# Patient Record
Sex: Female | Born: 1975 | Race: Black or African American | Hispanic: No | Marital: Single | Smoking: Current every day smoker
Health system: Southern US, Community
[De-identification: ages and names within clinical notes are randomized; demographics above are authoritative.]

## PROBLEM LIST (undated history)

## (undated) DIAGNOSIS — K219 Gastro-esophageal reflux disease without esophagitis: Secondary | ICD-10-CM

## (undated) HISTORY — PX: CHOLECYSTECTOMY: SHX55

---

## 1997-09-26 ENCOUNTER — Emergency Department (HOSPITAL_COMMUNITY): Admission: EM | Admit: 1997-09-26 | Discharge: 1997-09-26 | Payer: Self-pay | Admitting: Emergency Medicine

## 1997-12-16 ENCOUNTER — Emergency Department (HOSPITAL_COMMUNITY): Admission: EM | Admit: 1997-12-16 | Discharge: 1997-12-16 | Payer: Self-pay | Admitting: Emergency Medicine

## 1998-06-16 ENCOUNTER — Emergency Department (HOSPITAL_COMMUNITY): Admission: EM | Admit: 1998-06-16 | Discharge: 1998-06-16 | Payer: Self-pay | Admitting: *Deleted

## 1999-06-10 ENCOUNTER — Encounter: Payer: Self-pay | Admitting: Emergency Medicine

## 1999-06-10 ENCOUNTER — Emergency Department (HOSPITAL_COMMUNITY): Admission: EM | Admit: 1999-06-10 | Discharge: 1999-06-10 | Payer: Self-pay | Admitting: Emergency Medicine

## 1999-12-05 ENCOUNTER — Inpatient Hospital Stay (HOSPITAL_COMMUNITY): Admission: AD | Admit: 1999-12-05 | Discharge: 1999-12-05 | Payer: Self-pay | Admitting: Obstetrics

## 2000-06-08 ENCOUNTER — Inpatient Hospital Stay (HOSPITAL_COMMUNITY): Admission: AD | Admit: 2000-06-08 | Discharge: 2000-06-08 | Payer: Self-pay | Admitting: Obstetrics

## 2000-07-11 ENCOUNTER — Emergency Department (HOSPITAL_COMMUNITY): Admission: EM | Admit: 2000-07-11 | Discharge: 2000-07-11 | Payer: Self-pay | Admitting: Emergency Medicine

## 2000-12-12 ENCOUNTER — Emergency Department (HOSPITAL_COMMUNITY): Admission: EM | Admit: 2000-12-12 | Discharge: 2000-12-12 | Payer: Self-pay | Admitting: Emergency Medicine

## 2001-05-03 ENCOUNTER — Encounter: Payer: Self-pay | Admitting: Emergency Medicine

## 2001-05-03 ENCOUNTER — Emergency Department (HOSPITAL_COMMUNITY): Admission: EM | Admit: 2001-05-03 | Discharge: 2001-05-03 | Payer: Self-pay

## 2001-09-25 ENCOUNTER — Inpatient Hospital Stay (HOSPITAL_COMMUNITY): Admission: AD | Admit: 2001-09-25 | Discharge: 2001-09-25 | Payer: Self-pay | Admitting: *Deleted

## 2001-09-26 ENCOUNTER — Emergency Department (HOSPITAL_COMMUNITY): Admission: EM | Admit: 2001-09-26 | Discharge: 2001-09-26 | Payer: Self-pay | Admitting: Emergency Medicine

## 2001-11-01 ENCOUNTER — Ambulatory Visit (HOSPITAL_COMMUNITY): Admission: RE | Admit: 2001-11-01 | Discharge: 2001-11-01 | Payer: Self-pay | Admitting: *Deleted

## 2001-11-16 ENCOUNTER — Inpatient Hospital Stay (HOSPITAL_COMMUNITY): Admission: AD | Admit: 2001-11-16 | Discharge: 2001-11-16 | Payer: Self-pay | Admitting: *Deleted

## 2001-11-23 ENCOUNTER — Emergency Department (HOSPITAL_COMMUNITY): Admission: EM | Admit: 2001-11-23 | Discharge: 2001-11-23 | Payer: Self-pay | Admitting: Emergency Medicine

## 2001-12-09 ENCOUNTER — Ambulatory Visit (HOSPITAL_COMMUNITY): Admission: RE | Admit: 2001-12-09 | Discharge: 2001-12-09 | Payer: Self-pay | Admitting: *Deleted

## 2002-02-15 ENCOUNTER — Inpatient Hospital Stay: Admission: AD | Admit: 2002-02-15 | Discharge: 2002-02-15 | Payer: Self-pay | Admitting: *Deleted

## 2002-04-08 ENCOUNTER — Inpatient Hospital Stay (HOSPITAL_COMMUNITY): Admission: AD | Admit: 2002-04-08 | Discharge: 2002-04-08 | Payer: Self-pay | Admitting: Family Medicine

## 2002-05-07 ENCOUNTER — Inpatient Hospital Stay (HOSPITAL_COMMUNITY): Admission: AD | Admit: 2002-05-07 | Discharge: 2002-05-09 | Payer: Self-pay | Admitting: Obstetrics and Gynecology

## 2002-06-08 ENCOUNTER — Emergency Department (HOSPITAL_COMMUNITY): Admission: AD | Admit: 2002-06-08 | Discharge: 2002-06-09 | Payer: Self-pay | Admitting: Emergency Medicine

## 2002-11-03 ENCOUNTER — Emergency Department (HOSPITAL_COMMUNITY): Admission: EM | Admit: 2002-11-03 | Discharge: 2002-11-03 | Payer: Self-pay | Admitting: Emergency Medicine

## 2003-03-01 ENCOUNTER — Emergency Department (HOSPITAL_COMMUNITY): Admission: EM | Admit: 2003-03-01 | Discharge: 2003-03-01 | Payer: Self-pay

## 2003-07-23 ENCOUNTER — Emergency Department (HOSPITAL_COMMUNITY): Admission: EM | Admit: 2003-07-23 | Discharge: 2003-07-24 | Payer: Self-pay | Admitting: Emergency Medicine

## 2003-08-22 ENCOUNTER — Ambulatory Visit (HOSPITAL_COMMUNITY): Admission: RE | Admit: 2003-08-22 | Discharge: 2003-08-22 | Payer: Self-pay | Admitting: Neurology

## 2003-08-25 ENCOUNTER — Encounter: Admission: RE | Admit: 2003-08-25 | Discharge: 2003-08-25 | Payer: Self-pay | Admitting: Neurology

## 2003-08-26 ENCOUNTER — Emergency Department (HOSPITAL_COMMUNITY): Admission: EM | Admit: 2003-08-26 | Discharge: 2003-08-26 | Payer: Self-pay | Admitting: *Deleted

## 2004-01-17 ENCOUNTER — Emergency Department (HOSPITAL_COMMUNITY): Admission: EM | Admit: 2004-01-17 | Discharge: 2004-01-17 | Payer: Self-pay | Admitting: Family Medicine

## 2004-08-02 ENCOUNTER — Ambulatory Visit (HOSPITAL_COMMUNITY): Admission: RE | Admit: 2004-08-02 | Discharge: 2004-08-02 | Payer: Self-pay | Admitting: Obstetrics & Gynecology

## 2004-10-04 ENCOUNTER — Emergency Department (HOSPITAL_COMMUNITY): Admission: EM | Admit: 2004-10-04 | Discharge: 2004-10-04 | Payer: Self-pay | Admitting: Emergency Medicine

## 2004-11-27 ENCOUNTER — Ambulatory Visit (HOSPITAL_COMMUNITY): Admission: RE | Admit: 2004-11-27 | Discharge: 2004-11-27 | Payer: Self-pay | Admitting: Obstetrics & Gynecology

## 2005-01-22 ENCOUNTER — Ambulatory Visit (HOSPITAL_COMMUNITY): Admission: RE | Admit: 2005-01-22 | Discharge: 2005-01-22 | Payer: Self-pay | Admitting: Obstetrics & Gynecology

## 2005-03-07 ENCOUNTER — Inpatient Hospital Stay (HOSPITAL_COMMUNITY): Admission: AD | Admit: 2005-03-07 | Discharge: 2005-03-10 | Payer: Self-pay | Admitting: Obstetrics & Gynecology

## 2006-07-13 ENCOUNTER — Emergency Department (HOSPITAL_COMMUNITY): Admission: EM | Admit: 2006-07-13 | Discharge: 2006-07-13 | Payer: Self-pay | Admitting: Family Medicine

## 2007-08-31 ENCOUNTER — Emergency Department (HOSPITAL_COMMUNITY): Admission: EM | Admit: 2007-08-31 | Discharge: 2007-08-31 | Payer: Self-pay | Admitting: Family Medicine

## 2007-10-06 ENCOUNTER — Emergency Department (HOSPITAL_COMMUNITY): Admission: EM | Admit: 2007-10-06 | Discharge: 2007-10-06 | Payer: Self-pay | Admitting: Emergency Medicine

## 2007-10-27 ENCOUNTER — Emergency Department (HOSPITAL_COMMUNITY): Admission: EM | Admit: 2007-10-27 | Discharge: 2007-10-27 | Payer: Self-pay | Admitting: Emergency Medicine

## 2007-11-13 ENCOUNTER — Emergency Department (HOSPITAL_COMMUNITY): Admission: EM | Admit: 2007-11-13 | Discharge: 2007-11-13 | Payer: Self-pay | Admitting: Emergency Medicine

## 2007-12-19 ENCOUNTER — Emergency Department (HOSPITAL_COMMUNITY): Admission: EM | Admit: 2007-12-19 | Discharge: 2007-12-19 | Payer: Self-pay | Admitting: Emergency Medicine

## 2007-12-27 ENCOUNTER — Encounter: Admission: RE | Admit: 2007-12-27 | Discharge: 2007-12-27 | Payer: Self-pay | Admitting: Internal Medicine

## 2008-01-01 ENCOUNTER — Emergency Department (HOSPITAL_COMMUNITY): Admission: EM | Admit: 2008-01-01 | Discharge: 2008-01-01 | Payer: Self-pay | Admitting: Emergency Medicine

## 2008-02-09 ENCOUNTER — Emergency Department (HOSPITAL_COMMUNITY): Admission: EM | Admit: 2008-02-09 | Discharge: 2008-02-09 | Payer: Self-pay | Admitting: Emergency Medicine

## 2008-02-13 ENCOUNTER — Emergency Department (HOSPITAL_COMMUNITY): Admission: EM | Admit: 2008-02-13 | Discharge: 2008-02-13 | Payer: Self-pay | Admitting: Family Medicine

## 2008-12-30 ENCOUNTER — Inpatient Hospital Stay (HOSPITAL_COMMUNITY): Admission: AD | Admit: 2008-12-30 | Discharge: 2008-12-30 | Payer: Self-pay | Admitting: Obstetrics & Gynecology

## 2009-11-01 ENCOUNTER — Inpatient Hospital Stay (HOSPITAL_COMMUNITY)
Admission: AD | Admit: 2009-11-01 | Discharge: 2009-11-01 | Payer: Self-pay | Source: Home / Self Care | Admitting: Obstetrics & Gynecology

## 2009-11-01 ENCOUNTER — Ambulatory Visit: Payer: Self-pay | Admitting: Family Medicine

## 2009-11-14 ENCOUNTER — Ambulatory Visit: Payer: Self-pay | Admitting: Obstetrics and Gynecology

## 2009-12-05 ENCOUNTER — Ambulatory Visit: Payer: Self-pay | Admitting: Obstetrics and Gynecology

## 2009-12-26 ENCOUNTER — Ambulatory Visit: Payer: Self-pay | Admitting: Obstetrics and Gynecology

## 2009-12-26 LAB — CONVERTED CEMR LAB
Chlamydia, Swab/Urine, PCR: NEGATIVE
GC Probe Amp, Urine: NEGATIVE

## 2009-12-27 ENCOUNTER — Encounter: Payer: Self-pay | Admitting: Obstetrics and Gynecology

## 2010-01-02 ENCOUNTER — Ambulatory Visit: Payer: Self-pay | Admitting: Obstetrics & Gynecology

## 2010-01-09 ENCOUNTER — Ambulatory Visit: Payer: Self-pay | Admitting: Obstetrics & Gynecology

## 2010-01-10 ENCOUNTER — Ambulatory Visit: Payer: Self-pay | Admitting: Obstetrics & Gynecology

## 2010-01-16 ENCOUNTER — Ambulatory Visit: Payer: Self-pay | Admitting: Obstetrics & Gynecology

## 2010-01-23 ENCOUNTER — Ambulatory Visit: Payer: Self-pay | Admitting: Obstetrics and Gynecology

## 2010-01-23 ENCOUNTER — Inpatient Hospital Stay (HOSPITAL_COMMUNITY)
Admission: AD | Admit: 2010-01-23 | Discharge: 2010-01-25 | Payer: Self-pay | Source: Home / Self Care | Attending: Obstetrics & Gynecology | Admitting: Obstetrics & Gynecology

## 2010-02-01 ENCOUNTER — Emergency Department (HOSPITAL_COMMUNITY)
Admission: EM | Admit: 2010-02-01 | Discharge: 2010-02-02 | Payer: Self-pay | Source: Home / Self Care | Admitting: Emergency Medicine

## 2010-04-08 LAB — CBC
HCT: 39 % (ref 36.0–46.0)
Hemoglobin: 13.9 g/dL (ref 12.0–15.0)
MCH: 30.5 pg (ref 26.0–34.0)
MCHC: 35.6 g/dL (ref 30.0–36.0)
MCV: 85.7 fL (ref 78.0–100.0)
Platelets: 299 10*3/uL (ref 150–400)
RBC: 4.55 MIL/uL (ref 3.87–5.11)
RDW: 13.8 % (ref 11.5–15.5)
WBC: 11.6 10*3/uL — ABNORMAL HIGH (ref 4.0–10.5)

## 2010-04-08 LAB — POCT URINALYSIS DIPSTICK
Bilirubin Urine: NEGATIVE
Glucose, UA: NEGATIVE mg/dL
Hgb urine dipstick: NEGATIVE
Ketones, ur: NEGATIVE mg/dL
Nitrite: NEGATIVE
Protein, ur: NEGATIVE mg/dL
Specific Gravity, Urine: 1.015 (ref 1.005–1.030)
Urobilinogen, UA: 1 mg/dL (ref 0.0–1.0)
pH: 6.5 (ref 5.0–8.0)

## 2010-04-08 LAB — RPR: RPR Ser Ql: NONREACTIVE

## 2010-04-09 LAB — POCT URINALYSIS DIPSTICK
Bilirubin Urine: NEGATIVE
Bilirubin Urine: NEGATIVE
Glucose, UA: NEGATIVE mg/dL
Glucose, UA: NEGATIVE mg/dL
Hgb urine dipstick: NEGATIVE
Hgb urine dipstick: NEGATIVE
Ketones, ur: 40 mg/dL — AB
Ketones, ur: NEGATIVE mg/dL
Nitrite: NEGATIVE
Nitrite: NEGATIVE
Protein, ur: NEGATIVE mg/dL
Protein, ur: NEGATIVE mg/dL
Specific Gravity, Urine: 1.015 (ref 1.005–1.030)
Specific Gravity, Urine: 1.02 (ref 1.005–1.030)
Urobilinogen, UA: 1 mg/dL (ref 0.0–1.0)
Urobilinogen, UA: 1 mg/dL (ref 0.0–1.0)
pH: 5.5 (ref 5.0–8.0)
pH: 6.5 (ref 5.0–8.0)

## 2010-04-10 LAB — CBC
HCT: 33.1 % — ABNORMAL LOW (ref 36.0–46.0)
Hemoglobin: 11.6 g/dL — ABNORMAL LOW (ref 12.0–15.0)
MCH: 34 pg (ref 26.0–34.0)
MCHC: 35.1 g/dL (ref 30.0–36.0)
MCV: 96.7 fL (ref 78.0–100.0)
Platelets: 261 10*3/uL (ref 150–400)
RBC: 3.42 MIL/uL — ABNORMAL LOW (ref 3.87–5.11)
RDW: 14.2 % (ref 11.5–15.5)
WBC: 10.6 10*3/uL — ABNORMAL HIGH (ref 4.0–10.5)

## 2010-04-10 LAB — URINE MICROSCOPIC-ADD ON

## 2010-04-10 LAB — URINALYSIS, ROUTINE W REFLEX MICROSCOPIC
Bilirubin Urine: NEGATIVE
Glucose, UA: NEGATIVE mg/dL
Hgb urine dipstick: NEGATIVE
Ketones, ur: 15 mg/dL — AB
Nitrite: NEGATIVE
Protein, ur: NEGATIVE mg/dL
Specific Gravity, Urine: >1.03 — ABNORMAL HIGH (ref 1.005–1.030)
Urobilinogen, UA: 4 mg/dL — ABNORMAL HIGH (ref 0.0–1.0)
pH: 6 (ref 5.0–8.0)

## 2010-04-10 LAB — DIFFERENTIAL
Basophils Absolute: 0 10*3/uL (ref 0.0–0.1)
Basophils Relative: 0 % (ref 0–1)
Eosinophils Absolute: 0.4 10*3/uL (ref 0.0–0.7)
Eosinophils Relative: 4 % (ref 0–5)
Lymphocytes Relative: 10 % — ABNORMAL LOW (ref 12–46)
Lymphs Abs: 1.1 10*3/uL (ref 0.7–4.0)
Monocytes Absolute: 1.1 10*3/uL — ABNORMAL HIGH (ref 0.1–1.0)
Monocytes Relative: 10 % (ref 3–12)
Neutro Abs: 8 10*3/uL — ABNORMAL HIGH (ref 1.7–7.7)
Neutrophils Relative %: 75 % (ref 43–77)

## 2010-04-10 LAB — RPR: RPR Ser Ql: NONREACTIVE

## 2010-04-10 LAB — STREP B DNA PROBE: Strep Group B Ag: NEGATIVE

## 2010-04-10 LAB — RAPID URINE DRUG SCREEN, HOSP PERFORMED: Barbiturates: NOT DETECTED

## 2010-04-10 LAB — POCT URINALYSIS DIPSTICK
Bilirubin Urine: NEGATIVE
Glucose, UA: NEGATIVE mg/dL
Hgb urine dipstick: NEGATIVE
Ketones, ur: NEGATIVE mg/dL
Nitrite: NEGATIVE
Protein, ur: NEGATIVE mg/dL
Specific Gravity, Urine: 1.01 (ref 1.005–1.030)
Urobilinogen, UA: 1 mg/dL (ref 0.0–1.0)
pH: 6 (ref 5.0–8.0)

## 2010-04-10 LAB — HEPATITIS B SURFACE ANTIGEN: Hepatitis B Surface Ag: NEGATIVE

## 2010-04-10 LAB — HIV ANTIBODY (ROUTINE TESTING W REFLEX): HIV: NONREACTIVE

## 2010-04-10 LAB — TYPE AND SCREEN
ABO/RH(D): B POS
Antibody Screen: NEGATIVE

## 2010-04-10 LAB — SICKLE CELL SCREEN: Sickle Cell Screen: NEGATIVE

## 2010-04-10 LAB — RUBELLA SCREEN: Rubella: 18.7 IU/mL — ABNORMAL HIGH

## 2010-04-30 LAB — HCG, QUANTITATIVE, PREGNANCY: hCG, Beta Chain, Quant, S: 8489 m[IU]/mL — ABNORMAL HIGH (ref <5)

## 2010-04-30 LAB — ABO/RH: ABO/RH(D): B POS

## 2010-04-30 LAB — WET PREP, GENITAL: Trich, Wet Prep: NONE SEEN

## 2010-04-30 LAB — CBC
MCHC: 32.8 g/dL (ref 30.0–36.0)
MCV: 89.6 fL (ref 78.0–100.0)
RDW: 13.4 % (ref 11.5–15.5)

## 2010-04-30 LAB — GC/CHLAMYDIA PROBE AMP, GENITAL
Chlamydia, DNA Probe: NEGATIVE
GC Probe Amp, Genital: NEGATIVE

## 2010-05-13 LAB — URINALYSIS, ROUTINE W REFLEX MICROSCOPIC
Bilirubin Urine: NEGATIVE
Glucose, UA: NEGATIVE mg/dL
Hgb urine dipstick: NEGATIVE
Protein, ur: NEGATIVE mg/dL
Specific Gravity, Urine: 1.018 (ref 1.005–1.030)
Urobilinogen, UA: 1 mg/dL (ref 0.0–1.0)

## 2010-05-13 LAB — WET PREP, GENITAL
Trich, Wet Prep: NONE SEEN
Yeast Wet Prep HPF POC: NONE SEEN

## 2010-05-13 LAB — GC/CHLAMYDIA PROBE AMP, GENITAL: Chlamydia, DNA Probe: NEGATIVE

## 2010-06-14 NOTE — H&P (Signed)
NAMEDHARMA, PARE           ACCOUNT NO.:  [id number]   MEDICAL RECORD NO.:  [id number]          PATIENT TYPE:  AMB   LOCATION:  SDC                           FACILITY:  [facility]   PHYSICIAN:  Roseanna Rainbow, M.D.DATE OF BIRTH:  [birth year]   DATE OF ADMISSION:  DATE OF DISCHARGE:                                HISTORY & PHYSICAL   HISTORY OF PRESENT ILLNESS:  The patient is a 35 year old who presents for a  laparoscopic bilateral tubal ligation.   ALLERGIES:  No known drug allergies.   PAST MEDICAL HISTORY:  She denies.   PAST SURGICAL HISTORY:  She denies.   SOCIAL HISTORY:  She has a history of tobacco use.  Does not give any  significant history of alcohol usage.  Denies illicit drug use.   FAMILY HISTORY:  Asthma, hypertension.   PAST OBSTETRICAL HISTORY:  She is status post three spontaneous vaginal  deliveries.   MEDICATIONS:  None.   PHYSICAL EXAMINATION:  VITAL SIGNS:  Stable.  Afebrile.  GENERAL:  Well-developed, well-nourished, in no apparent distress.  LUNGS:  Clear to auscultation.  HEART:  Regular rate and rhythm.  ABDOMEN:  Soft, nontender.  No organomegaly.  PELVIC:  Normal EG/BUS.  Bimanual exam - the uterus is small, anteverted,  nontender.  Adnexa are nonpalpable, nontender bilaterally.   ASSESSMENT:  Multiparous female who desires a sterilization procedure.   PLAN:  The planned procedure is a laparoscopic bilateral tubal ligation.  The risks, benefits and alternative forms of management were reviewed with  the patient, and informed consent has been obtained.      Roseanna Rainbow, M.D.  Electronically Signed     LAJ/MEDQ  D:  05/07/2005  T:  05/07/2005  Job:  [id number]

## 2010-10-28 LAB — POCT URINALYSIS DIP (DEVICE)
Glucose, UA: NEGATIVE
Nitrite: NEGATIVE
Specific Gravity, Urine: >=1.03
pH: 6

## 2010-10-28 LAB — COMPREHENSIVE METABOLIC PANEL
Albumin: 3.6
Alkaline Phosphatase: 65
BUN: 7
Calcium: 9.9
Creatinine, Ser: 0.71
Glucose, Bld: 109 — ABNORMAL HIGH
Total Protein: 6.9

## 2010-10-28 LAB — DIFFERENTIAL
Basophils Relative: 1
Lymphocytes Relative: 32
Monocytes Absolute: 0.5
Monocytes Relative: 9
Neutro Abs: 3
Neutrophils Relative %: 56

## 2010-10-28 LAB — CBC
HCT: 39.8
Hemoglobin: 13.5
MCHC: 33.8
MCV: 86.2
Platelets: 383
RDW: 13.8

## 2010-10-28 LAB — URINALYSIS, ROUTINE W REFLEX MICROSCOPIC
Glucose, UA: NEGATIVE
Hgb urine dipstick: NEGATIVE
Ketones, ur: NEGATIVE
Protein, ur: NEGATIVE
Urobilinogen, UA: 0.2

## 2010-10-28 LAB — POCT PREGNANCY, URINE: Preg Test, Ur: NEGATIVE

## 2010-10-28 LAB — LIPASE, BLOOD: Lipase: 28

## 2010-10-28 LAB — WET PREP, GENITAL
Clue Cells Wet Prep HPF POC: NONE SEEN
Yeast Wet Prep HPF POC: NONE SEEN

## 2010-10-28 LAB — GC/CHLAMYDIA PROBE AMP, GENITAL: Chlamydia, DNA Probe: NEGATIVE

## 2010-10-30 LAB — DIFFERENTIAL
Basophils Absolute: 0.3 — ABNORMAL HIGH
Basophils Relative: 4 — ABNORMAL HIGH
Eosinophils Relative: 3
Monocytes Absolute: 0.9
Monocytes Relative: 13 — ABNORMAL HIGH
Neutro Abs: 2.3

## 2010-10-30 LAB — COMPREHENSIVE METABOLIC PANEL
AST: 15
Albumin: 3.5
Alkaline Phosphatase: 63
BUN: 8
Chloride: 106
GFR calc Af Amer: >60
Potassium: 3.5
Sodium: 138
Total Bilirubin: 0.5
Total Protein: 6.6

## 2010-10-30 LAB — CBC
Platelets: 334
RDW: 13.3
WBC: 7.3

## 2010-10-30 LAB — POCT CARDIAC MARKERS: Troponin i, poc: <0.05

## 2010-11-01 LAB — COMPREHENSIVE METABOLIC PANEL
Alkaline Phosphatase: 71 U/L (ref 39–117)
BUN: 12 mg/dL (ref 6–23)
Chloride: 103 mEq/L (ref 96–112)
Creatinine, Ser: 0.8 mg/dL (ref 0.4–1.2)
GFR calc non Af Amer: >60 mL/min (ref >60)
Glucose, Bld: 98 mg/dL (ref 70–99)
Potassium: 4.3 mEq/L (ref 3.5–5.1)
Total Bilirubin: 0.9 mg/dL (ref 0.3–1.2)

## 2010-11-01 LAB — CBC
HCT: 42.7 % (ref 36.0–46.0)
Hemoglobin: 14.3 g/dL (ref 12.0–15.0)
MCV: 86.1 fL (ref 78.0–100.0)
Platelets: 351 10*3/uL (ref 150–400)
RDW: 13.4 % (ref 11.5–15.5)
WBC: 7.4 10*3/uL (ref 4.0–10.5)

## 2010-11-01 LAB — DIFFERENTIAL
Basophils Absolute: 0 10*3/uL (ref 0.0–0.1)
Basophils Relative: 0 % (ref 0–1)
Lymphocytes Relative: 26 % (ref 12–46)
Neutro Abs: 4.7 10*3/uL (ref 1.7–7.7)
Neutrophils Relative %: 63 % (ref 43–77)

## 2010-11-01 LAB — POCT CARDIAC MARKERS
CKMB, poc: <1 ng/mL — ABNORMAL LOW (ref 1.0–8.0)
Myoglobin, poc: 62.9 ng/mL (ref 12–200)

## 2010-11-01 LAB — LIPASE, BLOOD: Lipase: 32 U/L (ref 11–59)

## 2012-07-20 ENCOUNTER — Emergency Department (HOSPITAL_COMMUNITY)
Admission: EM | Admit: 2012-07-20 | Discharge: 2012-07-20 | Disposition: A | Discharge status: Home / Self Care | Payer: Medicaid Other | Attending: Emergency Medicine | Admitting: Emergency Medicine

## 2012-07-20 ENCOUNTER — Encounter (HOSPITAL_COMMUNITY): Payer: Self-pay | Admitting: Emergency Medicine

## 2012-07-20 DIAGNOSIS — Z3202 Encounter for pregnancy test, result negative: Secondary | ICD-10-CM | POA: Insufficient documentation

## 2012-07-20 DIAGNOSIS — F17200 Nicotine dependence, unspecified, uncomplicated: Secondary | ICD-10-CM | POA: Insufficient documentation

## 2012-07-20 DIAGNOSIS — N949 Unspecified condition associated with female genital organs and menstrual cycle: Secondary | ICD-10-CM | POA: Insufficient documentation

## 2012-07-20 DIAGNOSIS — N390 Urinary tract infection, site not specified: Secondary | ICD-10-CM

## 2012-07-20 LAB — URINE MICROSCOPIC-ADD ON

## 2012-07-20 LAB — URINALYSIS, ROUTINE W REFLEX MICROSCOPIC
Bilirubin Urine: NEGATIVE
Glucose, UA: NEGATIVE mg/dL
Ketones, ur: NEGATIVE mg/dL
Protein, ur: 30 mg/dL — AB

## 2012-07-20 LAB — PREGNANCY, URINE: Preg Test, Ur: NEGATIVE

## 2012-07-20 MED ORDER — CEPHALEXIN 500 MG PO CAPS: 500.0000 mg | ORAL_CAPSULE | Freq: Four times a day (QID) | ORAL | Status: DC

## 2012-07-20 MED ORDER — PHENAZOPYRIDINE HCL 200 MG PO TABS: 200.0000 mg | ORAL_TABLET | Freq: Three times a day (TID) | ORAL | Status: DC

## 2012-07-20 MED ORDER — CEPHALEXIN 500 MG PO CAPS
500.0000 mg | ORAL_CAPSULE | Freq: Once | ORAL | Status: AC
  Administered 2012-07-20: 500 mg via ORAL
  Filled 2012-07-20: qty 1

## 2012-07-20 NOTE — ED Notes (Signed)
Pt c/o pain during urination since Friday.  Has been drinking water and cranberry juice hoping it would help but states she is still experiencing dysuria.

## 2012-07-20 NOTE — ED Provider Notes (Signed)
History    CSN: [id number] Arrival date & time 07/20/12  1035  First MD Initiated Contact with Patient 07/20/12 1041     Chief Complaint  Patient presents with  . Dysuria   (Consider location/radiation/quality/duration/timing/severity/associated sxs/prior Treatment) Patient is a 37 y.o. female presenting with dysuria. The history is provided by the patient. No language interpreter was used.  Dysuria Pain quality:  Burning Pain severity now: Pain with urination only. Urinary symptoms: foul-smelling urine   Urinary symptoms: no hematuria   Associated symptoms: no abdominal pain, no fever, no flank pain, no nausea, no vaginal discharge and no vomiting   Associated symptoms comment:  Painful urination for the past 3 days. No abdominal pain, nausea, vomiting or fever. She denies vaginal discharge.   History reviewed. No pertinent past medical history. Past Surgical History  Procedure Laterality Date  . Cholecystectomy     History reviewed. No pertinent family history. History  Substance Use Topics  . Smoking status: Current Every Day Smoker -- 0.50 packs/day    Types: Cigarettes  . Smokeless tobacco: Not on file  . Alcohol Use: No   OB History   Grav Para Term Preterm Abortions TAB SAB Ect Mult Living                 Review of Systems  Constitutional: Negative for fever and chills.  Gastrointestinal: Negative.  Negative for nausea, vomiting and abdominal pain.  Genitourinary: Positive for dysuria and pelvic pain. Negative for flank pain and vaginal discharge.  Musculoskeletal: Negative.  Negative for back pain.  Skin: Negative.     Allergies  Review of patient's allergies indicates no known allergies.  Home Medications   Current Outpatient Rx  Name  Route  Sig  Dispense  Refill  . ibuprofen (ADVIL,MOTRIN) 200 MG tablet   Oral   Take 600 mg by mouth every 6 (six) hours as needed for pain.          BP 123/68  Pulse 87  Temp(Src) 98.6 F (37 C) (Oral)  Resp  18  SpO2 96%  LMP 07/13/2012 Physical Exam  Constitutional: She is oriented to person, place, and time. She appears well-developed and well-nourished.  Neck: Normal range of motion.  Pulmonary/Chest: Effort normal.  Abdominal: Soft. There is no tenderness. There is no rebound and no guarding.  Neurological: She is alert and oriented to person, place, and time.  Skin: Skin is warm and dry.    ED Course  Procedures (including critical care time) Labs Reviewed  URINALYSIS, ROUTINE W REFLEX MICROSCOPIC  PREGNANCY, URINE   Results for orders placed during the hospital encounter of 07/20/12  URINALYSIS, ROUTINE W REFLEX MICROSCOPIC      Result Value Range   Color, Urine YELLOW  YELLOW   APPearance CLOUDY (*) CLEAR   Specific Gravity, Urine 1.023  1.005 - 1.030   pH 5.5  5.0 - 8.0   Glucose, UA NEGATIVE  NEGATIVE mg/dL   Hgb urine dipstick SMALL (*) NEGATIVE   Bilirubin Urine NEGATIVE  NEGATIVE   Ketones, ur NEGATIVE  NEGATIVE mg/dL   Protein, ur 30 (*) NEGATIVE mg/dL   Urobilinogen, UA 0.2  0.0 - 1.0 mg/dL   Nitrite NEGATIVE  NEGATIVE   Leukocytes, UA LARGE (*) NEGATIVE  PREGNANCY, URINE      Result Value Range   Preg Test, Ur NEGATIVE  NEGATIVE  URINE MICROSCOPIC-ADD ON      Result Value Range   Squamous Epithelial / LPF FEW (*) RARE  WBC, UA TOO NUMEROUS TO COUNT  <3 WBC/hpf   RBC / HPF 7-10  <3 RBC/hpf   Bacteria, UA FEW (*) RARE    No results found. No diagnosis found. 1. UTI  MDM  Uncomplicated UTI. Patient is stable, NAD.  Arnoldo Hooker, PA-C 07/20/12 1213

## 2012-07-21 NOTE — ED Provider Notes (Signed)
Medical screening examination/treatment/procedure(s) were performed by non-physician practitioner and as supervising physician I was immediately available for consultation/collaboration.    Vida Roller, MD 07/21/12 1739

## 2012-07-22 LAB — URINE CULTURE: Colony Count: >=100000

## 2012-07-23 NOTE — ED Notes (Signed)
Post ED Visit - Positive Culture Follow-up  Culture report reviewed by antimicrobial stewardship pharmacist: []  Wes Dulaney, Pharm.D., BCPS [x]  Celedonio Miyamoto, Pharm.D., BCPS []  Georgina Pillion, Pharm.D., BCPS []  [place], [address].D., BCPS, AAHIVP []  Estella Husk, Pharm.D., BCPS, AAHIVP  Positive Urine culture Treated with Cephalexin, organism sensitive to the same and no further patient follow-up is required at this time.  Larena Sox 07/23/2012, 1:55 PM

## 2012-08-03 ENCOUNTER — Emergency Department (HOSPITAL_COMMUNITY)
Admission: EM | Admit: 2012-08-03 | Discharge: 2012-08-03 | Disposition: A | Discharge status: Home / Self Care | Payer: Medicaid Other | Attending: Emergency Medicine | Admitting: Emergency Medicine

## 2012-08-03 ENCOUNTER — Encounter (HOSPITAL_COMMUNITY): Payer: Self-pay | Admitting: Emergency Medicine

## 2012-08-03 DIAGNOSIS — Z87440 Personal history of urinary (tract) infections: Secondary | ICD-10-CM | POA: Insufficient documentation

## 2012-08-03 DIAGNOSIS — J069 Acute upper respiratory infection, unspecified: Secondary | ICD-10-CM | POA: Insufficient documentation

## 2012-08-03 DIAGNOSIS — H9209 Otalgia, unspecified ear: Secondary | ICD-10-CM | POA: Insufficient documentation

## 2012-08-03 DIAGNOSIS — J029 Acute pharyngitis, unspecified: Secondary | ICD-10-CM | POA: Insufficient documentation

## 2012-08-03 DIAGNOSIS — N949 Unspecified condition associated with female genital organs and menstrual cycle: Secondary | ICD-10-CM | POA: Insufficient documentation

## 2012-08-03 DIAGNOSIS — F17200 Nicotine dependence, unspecified, uncomplicated: Secondary | ICD-10-CM | POA: Insufficient documentation

## 2012-08-03 DIAGNOSIS — N898 Other specified noninflammatory disorders of vagina: Secondary | ICD-10-CM

## 2012-08-03 DIAGNOSIS — Z9889 Other specified postprocedural states: Secondary | ICD-10-CM | POA: Insufficient documentation

## 2012-08-03 DIAGNOSIS — L293 Anogenital pruritus, unspecified: Secondary | ICD-10-CM | POA: Insufficient documentation

## 2012-08-03 DIAGNOSIS — R109 Unspecified abdominal pain: Secondary | ICD-10-CM | POA: Insufficient documentation

## 2012-08-03 DIAGNOSIS — Z3202 Encounter for pregnancy test, result negative: Secondary | ICD-10-CM | POA: Insufficient documentation

## 2012-08-03 DIAGNOSIS — Z9089 Acquired absence of other organs: Secondary | ICD-10-CM | POA: Insufficient documentation

## 2012-08-03 LAB — URINALYSIS, ROUTINE W REFLEX MICROSCOPIC
Bilirubin Urine: NEGATIVE
Glucose, UA: NEGATIVE mg/dL
Hgb urine dipstick: NEGATIVE
Ketones, ur: NEGATIVE mg/dL
Nitrite: NEGATIVE
Protein, ur: NEGATIVE mg/dL
Specific Gravity, Urine: 1.016 (ref 1.005–1.030)
Urobilinogen, UA: 1 mg/dL (ref 0.0–1.0)
pH: 5.5 (ref 5.0–8.0)

## 2012-08-03 LAB — URINE MICROSCOPIC-ADD ON

## 2012-08-03 LAB — WET PREP, GENITAL
Clue Cells Wet Prep HPF POC: NONE SEEN
Trich, Wet Prep: NONE SEEN
Yeast Wet Prep HPF POC: NONE SEEN

## 2012-08-03 LAB — PREGNANCY, URINE: Preg Test, Ur: NEGATIVE

## 2012-08-03 MED ORDER — FLUCONAZOLE 150 MG PO TABS: 150.0000 mg | ORAL_TABLET | Freq: Once | ORAL | Status: DC

## 2012-08-03 NOTE — ED Notes (Signed)
Pt c/o vaginal itching after being on antibiotics for UTI; pt sts right ear pain and sore throat x 2 days

## 2012-08-03 NOTE — ED Provider Notes (Signed)
History    CSN: [id number] Arrival date & time 08/03/12  1750  First MD Initiated Contact with Patient 08/03/12 1757     Chief Complaint  Patient presents with  . Vaginal Itching  . Otalgia  . Sore Throat   (Consider location/radiation/quality/duration/timing/severity/associated sxs/prior Treatment) HPI Pt is a 37yo female with recent tx with keflex and pyridium for UTI last week, presenting with 2 day hx of vaginal itching and irritation, concerned for yeast infection so she has tried monostat and vagisil wipes, however states irritation is worsening, with lower abdominal cramping 7/10.  Denies vaginal discharge or bleeding. Reports LMP last month, nl per pt but pt requesting pregnancy test.  Pt also c/o 1 day hx of right ear pain, 2/10, improved with ibuprofen, and mild sore throat that started this morning.  Denies difficulty breathing or swallowing. Denies chest pain, cough, SOB, fever, n/v/d. Denies dysuria or hematuria. Pt states she is not on birth control.   History reviewed. No pertinent past medical history. Past Surgical History  Procedure Laterality Date  . Cholecystectomy     History reviewed. No pertinent family history. History  Substance Use Topics  . Smoking status: Current Every Day Smoker -- 0.50 packs/day    Types: Cigarettes  . Smokeless tobacco: Not on file  . Alcohol Use: No   OB History   Grav Para Term Preterm Abortions TAB SAB Ect Mult Living                 Review of Systems  Constitutional: Negative for fever and chills.  HENT: Positive for ear pain ( right) and sore throat. Negative for hearing loss, congestion, drooling, mouth sores, trouble swallowing, neck pain, neck stiffness, dental problem, voice change, sinus pressure and ear discharge.   Respiratory: Negative for cough and shortness of breath.   Cardiovascular: Negative for chest pain.  Gastrointestinal: Positive for abdominal pain ( lower).  Genitourinary: Positive for vaginal pain (  itching and irritation). Negative for dysuria, urgency, hematuria, flank pain, vaginal bleeding, vaginal discharge and menstrual problem.  All other systems reviewed and are negative.    Allergies  Review of patient's allergies indicates no known allergies.  Home Medications   Current Outpatient Rx  Name  Route  Sig  Dispense  Refill  . ibuprofen (ADVIL,MOTRIN) 200 MG tablet   Oral   Take 600 mg by mouth every 6 (six) hours as needed for pain.         . fluconazole (DIFLUCAN) 150 MG tablet   Oral   Take 1 tablet (150 mg total) by mouth once.   1 tablet   0    BP 136/78  Pulse 94  Temp(Src) 98.8 F (37.1 C) (Oral)  Resp 18  SpO2 97%  LMP 07/13/2012 Physical Exam  Nursing note and vitals reviewed. Constitutional: She appears well-developed and well-nourished. No distress.  Pt resting comfortably in exam bed. NAD.   HENT:  Head: Normocephalic and atraumatic.  Right Ear: Hearing, tympanic membrane, external ear and ear canal normal.  Left Ear: Hearing, tympanic membrane, external ear and ear canal normal.  Nose: Nose normal.  Mouth/Throat: Uvula is midline and mucous membranes are normal. Posterior oropharyngeal erythema present. No oropharyngeal exudate, posterior oropharyngeal edema or tonsillar abscesses.  Eyes: Conjunctivae are normal. No scleral icterus.  Neck: Normal range of motion. Neck supple.  No nuchal rigidity or meningeal signs.    Cardiovascular: Normal rate, regular rhythm and normal heart sounds.   Pulmonary/Chest: Effort normal and  breath sounds normal. No respiratory distress. She has no wheezes. She has no rales. She exhibits no tenderness.  Abdominal: Soft. Bowel sounds are normal. She exhibits no distension and no mass. There is no tenderness. There is no rebound and no guarding. Hernia confirmed negative in the right inguinal area and confirmed negative in the left inguinal area.  Soft, NDNT  Genitourinary: Uterus normal. No labial fusion. There is  no rash, tenderness, lesion or injury on the right labia. There is no rash, tenderness, lesion or injury on the left labia. Cervix exhibits discharge. Cervix exhibits no motion tenderness and no friability. Right adnexum displays no mass, no tenderness and no fullness. Left adnexum displays no mass, no tenderness and no fullness. No erythema, tenderness or bleeding around the vagina. No foreign body around the vagina. No signs of injury around the vagina. Vaginal discharge ( thick, white) found.  Thick white vaginal discharge, no adnexal tenderness or masses, no CMT.  Musculoskeletal: Normal range of motion.  Lymphadenopathy:       Right: No inguinal adenopathy present.       Left: No inguinal adenopathy present.  Neurological: She is alert.  Skin: Skin is warm and dry. She is not diaphoretic.    ED Course  Procedures (including critical care time) Labs Reviewed  WET PREP, GENITAL - Abnormal; Notable for the following:    WBC, Wet Prep HPF POC FEW (*)    All other components within normal limits  URINALYSIS, ROUTINE W REFLEX MICROSCOPIC - Abnormal; Notable for the following:    Leukocytes, UA SMALL (*)    All other components within normal limits  URINE MICROSCOPIC-ADD ON - Abnormal; Notable for the following:    Squamous Epithelial / LPF FEW (*)    Bacteria, UA FEW (*)    All other components within normal limits  GC/CHLAMYDIA PROBE AMP  URINE CULTURE  PREGNANCY, URINE   No results found. 1. Vaginal itching   2. URI, acute     MDM  Pt has likely yeast infection.  Will get UA, urine preg, wet prep and gc/chlamydia and tx appropriately.    Right ear pain and throat pain: exam- R ear: nl TM & ear canal. L ear: nl. Throat: mild erythema w/o edema or exudate.  Will tx as viral URI.  Advised pt to tx pain with OTC acetaminophen and ibuprofen.    UA: unremarkable Urine preg: neg Wet prep: unremarkable  Discussed pt with Dr. Ethelda Chick, will tx pt with 1 dose of diflucan. Rx:  diflucan Will discharge pt home and have her f/u with Dr. Concepcion Elk. Return precautions given. Pt verbalized understanding and agreement with tx plan. Vitals: unremarkable. Discharged in stable condition.      Junius Finner, PA-C 08/03/12 1857

## 2012-08-04 LAB — URINE CULTURE
Colony Count: NO GROWTH
Culture: NO GROWTH

## 2012-08-04 LAB — GC/CHLAMYDIA PROBE AMP
CT Probe RNA: NEGATIVE
GC Probe RNA: NEGATIVE

## 2012-08-04 NOTE — ED Provider Notes (Signed)
Medical screening examination/treatment/procedure(s) were performed by non-physician practitioner and as supervising physician I was immediately available for consultation/collaboration.  Evaan Tidwell, MD 08/04/12 0152 

## 2013-06-23 ENCOUNTER — Encounter (HOSPITAL_COMMUNITY): Payer: Self-pay | Admitting: Emergency Medicine

## 2013-06-23 ENCOUNTER — Emergency Department (HOSPITAL_COMMUNITY)
Admission: EM | Admit: 2013-06-23 | Discharge: 2013-06-23 | Disposition: A | Discharge status: Home / Self Care | Payer: Medicaid Other | Attending: Emergency Medicine | Admitting: Emergency Medicine

## 2013-06-23 DIAGNOSIS — Y9289 Other specified places as the place of occurrence of the external cause: Secondary | ICD-10-CM | POA: Insufficient documentation

## 2013-06-23 DIAGNOSIS — Y9389 Activity, other specified: Secondary | ICD-10-CM | POA: Insufficient documentation

## 2013-06-23 DIAGNOSIS — Y990 Civilian activity done for income or pay: Secondary | ICD-10-CM | POA: Insufficient documentation

## 2013-06-23 DIAGNOSIS — T6391XA Toxic effect of contact with unspecified venomous animal, accidental (unintentional), initial encounter: Secondary | ICD-10-CM | POA: Insufficient documentation

## 2013-06-23 DIAGNOSIS — T63461A Toxic effect of venom of wasps, accidental (unintentional), initial encounter: Secondary | ICD-10-CM | POA: Insufficient documentation

## 2013-06-23 DIAGNOSIS — F17200 Nicotine dependence, unspecified, uncomplicated: Secondary | ICD-10-CM | POA: Insufficient documentation

## 2013-06-23 DIAGNOSIS — M7989 Other specified soft tissue disorders: Secondary | ICD-10-CM | POA: Insufficient documentation

## 2013-06-23 DIAGNOSIS — T7840XA Allergy, unspecified, initial encounter: Secondary | ICD-10-CM

## 2013-06-23 MED ORDER — PREDNISONE 20 MG PO TABS
60.0000 mg | ORAL_TABLET | Freq: Once | ORAL | Status: AC
  Administered 2013-06-23: 60 mg via ORAL
  Filled 2013-06-23: qty 3

## 2013-06-23 MED ORDER — PREDNISONE 20 MG PO TABS: 40.0000 mg | ORAL_TABLET | Freq: Every day | ORAL | Status: DC

## 2013-06-23 MED ORDER — FAMOTIDINE 20 MG PO TABS: 20.0000 mg | ORAL_TABLET | Freq: Two times a day (BID) | ORAL | Status: DC

## 2013-06-23 NOTE — ED Notes (Signed)
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 Has been taking benadryl -- last being 2 hours ago.

## 2013-06-23 NOTE — ED Notes (Signed)
Unable to locate patient for triage x3 

## 2013-06-23 NOTE — ED Provider Notes (Signed)
CSN: [id number]     Arrival date & time 06/23/13  1751 History  This chart was scribed for non-physician practitioner Junious SilkHannah Dane Bloch, PA working with Junius ArgyleForrest S Harrison, MD by Elveria Risingimelie Horne, ED Scribe. This patient was seen in room TR05C/TR05C and the patient's care was started at 8:34 PM.   Chief Complaint  Patient presents with  . Insect Bite     The history is provided by the patient. No language interpreter was used.   HPI Comments: Sherri Hall is a 38 y.o. female who presents to the Emergency Department with several insect bites to her left forearm. Patient reports taking the trash out while at work yesterday and being stung by 2 yellow jackets. Patient reports immediate pain and redness after the attack. Patient presents with itching, redness and swelling at her bite sites. Patient has taken Benadryl, without relief. Patient able to remove the stingers.  Patient denies sensation of her throat closing, nausea, or vomiting.  Patient denies medical issues.    History reviewed. No pertinent past medical history. Past Surgical History  Procedure Laterality Date  . Cholecystectomy     No family history on file. History  Substance Use Topics  . Smoking status: Current Every Day Smoker -- 0.50 packs/day    Types: Cigarettes  . Smokeless tobacco: Not on file  . Alcohol Use: No   OB History   Grav Para Term Preterm Abortions TAB SAB Ect Mult Living                 Review of Systems  Constitutional: Negative for fever.  Gastrointestinal: Negative for nausea and vomiting.  Skin:       Insect bites  All other systems reviewed and are negative.     Allergies  Review of patient's allergies indicates no known allergies.  Home Medications   Prior to Admission medications   Not on File   Triage Vitals: BP 120/50  Pulse 97  Temp(Src) 98.2 F (36.8 C) (Oral)  Resp 20  SpO2 98%  LMP 06/09/2013 Physical Exam  Nursing note and vitals reviewed. Constitutional: She is  oriented to person, place, and time. She appears well-developed and well-nourished. No distress.  HENT:  Head: Normocephalic and atraumatic.  Right Ear: External ear normal.  Left Ear: External ear normal.  Nose: Nose normal.  Mouth/Throat: Oropharynx is clear and moist.  Eyes: Conjunctivae are normal.  Neck: Normal range of motion.  Cardiovascular: Normal rate, regular rhythm and normal heart sounds.   Pulmonary/Chest: Effort normal and breath sounds normal. No stridor. No respiratory distress. She has no wheezes. She has no rales.  Abdominal: Soft. She exhibits no distension.  Musculoskeletal: Normal range of motion.  Neurological: She is alert and oriented to person, place, and time. She has normal strength.  Skin: Skin is warm and dry. She is not diaphoretic. There is erythema.  Left arm: 8 cm area of warmth , erythema and swelling to left distal arm. Blanches to palpation.  No stingers seen retained in skin.  Compartment soft. Neurovascularly intact.   Psychiatric: She has a normal mood and affect. Her behavior is normal.    ED Course  Procedures (including critical care time) DIAGNOSTIC STUDIES: Oxygen Saturation is 98% on room air, normal by my interpretation.    COORDINATION OF CARE: 8:38 PM- Discussed treatment plan with patient at bedside and patient agreed to plan.    Labs Review Labs Reviewed - No data to display  Imaging Review No results found.  EKG Interpretation None      MDM   Final diagnoses:  Wasp sting  Allergic reaction   Patient presents to ED with continued redness and itching from wasp sting yesterday. There is no superimposed bacterial infection. Will d/c home with pepcid, prednisone burst, and benadryl. Return instructions given. Vital signs stable for discharge. Patient / Family / Caregiver informed of clinical course, understand medical decision-making process, and agree with plan.   I personally performed the services described in this  documentation, which was scribed in my presence. The recorded information has been reviewed and is accurate.    Mora Bellman, PA-C 06/24/13 1610

## 2013-06-23 NOTE — Discharge Instructions (Signed)
Bee, Wasp, or Hornet Sting °Your caregiver has diagnosed you as having an insect sting. An insect sting appears as a red lump in the skin that sometimes has a tiny hole in the center, or it may have a stinger in the center of the wound. The most common stings are from wasps, hornets and bees. °Individuals have different reactions to insect stings. °· A normal reaction may cause pain, swelling, and redness around the sting site. °· A localized allergic reaction may cause swelling and redness that extends beyond the sting site. °· A large local reaction may continue to develop over the next 12 to 36 hours. °· On occasion, the reactions can be severe (anaphylactic reaction). An anaphylactic reaction may cause wheezing; difficulty breathing; chest pain; fainting; raised, itchy, red patches on the skin; a sick feeling to your stomach (nausea); vomiting; cramping; or diarrhea. If you have had an anaphylactic reaction to an insect sting in the past, you are more likely to have one again. °HOME CARE INSTRUCTIONS  °· With bee stings, a small sac of poison is left in the wound. Brushing across this with something such as a credit card, or anything similar, will help remove this and decrease the amount of the reaction. This same procedure will not help a wasp sting as they do not leave behind a stinger and poison sac. °· Apply a cold compress for 10 to 20 minutes every hour for 1 to 2 days, depending on severity, to reduce swelling and itching. °· To lessen pain, a paste made of water and baking soda may be rubbed on the bite or sting and left on for 5 minutes. °· To relieve itching and swelling, you may use take medication or apply medicated creams or lotions as directed. °· Only take over-the-counter or prescription medicines for pain, discomfort, or fever as directed by your caregiver. °· Wash the sting site daily with soap and water. Apply antibiotic ointment on the sting site as directed. °· If you suffered a severe  reaction: °· If you did not require hospitalization, an adult will need to stay with you for 24 hours in case the symptoms return. °· You may need to wear a medical bracelet or necklace stating the allergy. °· You and your family need to learn when and how to use an anaphylaxis kit or epinephrine injection. °· If you have had a severe reaction before, always carry your anaphylaxis kit with you. °SEEK MEDICAL CARE IF:  °· None of the above helps within 2 to 3 days. °· The area becomes red, warm, tender, and swollen beyond the area of the bite or sting. °· You have an oral temperature above 102° F (38.9° C). °SEEK IMMEDIATE MEDICAL CARE IF:  °You have symptoms of an allergic reaction which are: °· Wheezing. °· Difficulty breathing. °· Chest pain. °· Lightheadedness or fainting. °· Itchy, raised, red patches on the skin. °· Nausea, vomiting, cramping or diarrhea. °ANY OF THESE SYMPTOMS MAY REPRESENT A SERIOUS PROBLEM THAT IS AN EMERGENCY. Do not wait to see if the symptoms will go away. Get medical help right away. Call your local emergency services (911 in [place] DO NOT drive yourself to the hospital. °MAKE SURE YOU:  °· Understand these instructions. °· Will watch your condition. °· Will get help right away if you are not doing well or get worse. °Document Released: 01/13/2005 Document Revised: 04/07/2011 Document Reviewed: 06/30/2009 °[employer]® Patient Information ©2014 [employer]. ° °

## 2013-06-28 NOTE — ED Provider Notes (Signed)
Medical screening examination/treatment/procedure(s) were performed by non-physician practitioner and as supervising physician I was immediately available for consultation/collaboration.   EKG Interpretation None        Sanay Belmar S Francina Beery, MD 06/28/13 1348 

## 2015-10-22 ENCOUNTER — Emergency Department (HOSPITAL_COMMUNITY)
Admission: EM | Admit: 2015-10-22 | Discharge: 2015-10-22 | Disposition: A | Discharge status: Home / Self Care | Payer: Medicaid Other | Attending: Emergency Medicine | Admitting: Emergency Medicine

## 2015-10-22 ENCOUNTER — Encounter (HOSPITAL_COMMUNITY): Payer: Self-pay | Admitting: Emergency Medicine

## 2015-10-22 DIAGNOSIS — R1084 Generalized abdominal pain: Secondary | ICD-10-CM | POA: Insufficient documentation

## 2015-10-22 DIAGNOSIS — Z5321 Procedure and treatment not carried out due to patient leaving prior to being seen by health care provider: Secondary | ICD-10-CM | POA: Insufficient documentation

## 2015-10-22 DIAGNOSIS — F17210 Nicotine dependence, cigarettes, uncomplicated: Secondary | ICD-10-CM | POA: Insufficient documentation

## 2015-10-22 HISTORY — DX: Gastro-esophageal reflux disease without esophagitis: K21.9

## 2015-10-22 LAB — URINALYSIS, ROUTINE W REFLEX MICROSCOPIC
BILIRUBIN URINE: NEGATIVE
Glucose, UA: NEGATIVE mg/dL
KETONES UR: NEGATIVE mg/dL
LEUKOCYTES UA: NEGATIVE
NITRITE: NEGATIVE
PROTEIN: NEGATIVE mg/dL
Specific Gravity, Urine: 1.012 (ref 1.005–1.030)
pH: 6.5 (ref 5.0–8.0)

## 2015-10-22 LAB — CBC
HEMATOCRIT: 38.5 % (ref 36.0–46.0)
Hemoglobin: 12.7 g/dL (ref 12.0–15.0)
MCH: 28 pg (ref 26.0–34.0)
MCHC: 33 g/dL (ref 30.0–36.0)
MCV: 84.8 fL (ref 78.0–100.0)
PLATELETS: 339 10*3/uL (ref 150–400)
RBC: 4.54 MIL/uL (ref 3.87–5.11)
RDW: 13.8 % (ref 11.5–15.5)
WBC: 9.9 10*3/uL (ref 4.0–10.5)

## 2015-10-22 LAB — COMPREHENSIVE METABOLIC PANEL
ALBUMIN: 3.4 g/dL — AB (ref 3.5–5.0)
ALK PHOS: 62 U/L (ref 38–126)
ALT: 12 U/L — ABNORMAL LOW (ref 14–54)
ANION GAP: 7 (ref 5–15)
AST: 19 U/L (ref 15–41)
BILIRUBIN TOTAL: 0.6 mg/dL (ref 0.3–1.2)
BUN: 8 mg/dL (ref 6–20)
CALCIUM: 9 mg/dL (ref 8.9–10.3)
CO2: 23 mmol/L (ref 22–32)
Chloride: 108 mmol/L (ref 101–111)
Creatinine, Ser: 0.77 mg/dL (ref 0.44–1.00)
GFR calc Af Amer: >60 mL/min (ref >60)
GLUCOSE: 116 mg/dL — AB (ref 65–99)
POTASSIUM: 4 mmol/L (ref 3.5–5.1)
Sodium: 138 mmol/L (ref 135–145)
TOTAL PROTEIN: 6.5 g/dL (ref 6.5–8.1)

## 2015-10-22 LAB — URINE MICROSCOPIC-ADD ON: WBC UA: NONE SEEN WBC/hpf (ref 0–5)

## 2015-10-22 LAB — LIPASE, BLOOD: Lipase: 38 U/L (ref 11–51)

## 2015-10-22 NOTE — ED Notes (Signed)
Called to reassess Vitals. No Answer

## 2015-10-22 NOTE — ED Triage Notes (Signed)
Pt sts generalized abd pain worse on right x 4 days; pt sts burning in nature

## 2015-10-22 NOTE — ED Notes (Signed)
Pt called for reassessment, no answer.

## 2017-06-06 ENCOUNTER — Encounter (HOSPITAL_COMMUNITY): Payer: Self-pay | Admitting: Emergency Medicine

## 2017-06-06 ENCOUNTER — Other Ambulatory Visit: Payer: Self-pay

## 2017-06-06 ENCOUNTER — Emergency Department (HOSPITAL_COMMUNITY)
Admission: EM | Admit: 2017-06-06 | Discharge: 2017-06-06 | Disposition: A | Discharge status: Home / Self Care | Payer: Self-pay | Attending: Emergency Medicine | Admitting: Emergency Medicine

## 2017-06-06 DIAGNOSIS — F17210 Nicotine dependence, cigarettes, uncomplicated: Secondary | ICD-10-CM | POA: Insufficient documentation

## 2017-06-06 DIAGNOSIS — H1033 Unspecified acute conjunctivitis, bilateral: Secondary | ICD-10-CM | POA: Insufficient documentation

## 2017-06-06 MED ORDER — CIPROFLOXACIN HCL 0.3 % OP OINT: TOPICAL_OINTMENT | OPHTHALMIC | 0 refills | Status: DC

## 2017-06-06 MED ORDER — FLUORESCEIN SODIUM 1 MG OP STRP
1.0000 | ORAL_STRIP | Freq: Once | OPHTHALMIC | Status: AC
  Administered 2017-06-06: 1 via OPHTHALMIC
  Filled 2017-06-06: qty 1

## 2017-06-06 MED ORDER — TETRACAINE HCL 0.5 % OP SOLN
2.0000 [drp] | Freq: Once | OPHTHALMIC | Status: AC
Start: 2017-06-06 — End: 2017-06-06
  Administered 2017-06-06: 2 [drp] via OPHTHALMIC
  Filled 2017-06-06: qty 4

## 2017-06-06 NOTE — ED Provider Notes (Signed)
[facility] EMERGENCY DEPARTMENT Provider Note   CSN: [id number] Arrival date & time: 06/06/17  1515     History   Chief Complaint Chief Complaint  Patient presents with  . Conjunctivitis    HPI Sherri Hall is a 42 y.o. female.  HPI   Sherri Hall is a 42yo female with no significant past medical history who presents emergency department for evaluation of bilateral eye redness and drainage.  Patient reports her symptoms started four days ago and began in the right eye but spread to the left eye yesterday. She endorses thick purulent drainage from both eyes.  States that her eyes feel stuck shut in the morning.  States that she has some soreness in the eyes as well.  She also reports blurry vision, right worse than left.  She states that she works at [employer], but denies any known contacts with pinkeye recently.  She was seen at [facility] regional ER and diagnosed with conjunctivitis 4 days ago, but despite taking Polytrim every 6 hours she has not had improvement.  She is not a contact lens wearer.  She denies recent trauma to the eye.  She denies fever, chills, diplopia, painful EOM, photophobia, eye itching.   Past Medical History:  Diagnosis Date  . Acid reflux     There are no active problems to display for this patient.   Past Surgical History:  Procedure Laterality Date  . CHOLECYSTECTOMY       OB History   None      Home Medications    Prior to Admission medications   Medication Sig Start Date End Date Taking? Authorizing Provider  famotidine (PEPCID) 20 MG tablet Take 1 tablet (20 mg total) by mouth 2 (two) times daily. 06/23/13   Junious Silk, PA-C  predniSONE (DELTASONE) 20 MG tablet Take 2 tablets (40 mg total) by mouth daily. 06/23/13   Junious Silk, PA-C    Family History No family history on file.  Social History Social History   Tobacco Use  . Smoking status: Current Every Day Smoker    Packs/day: 0.50      Types: Cigarettes  . Smokeless tobacco: Never Used  Substance Use Topics  . Alcohol use: No  . Drug use: No     Allergies   Patient has no known allergies.   Review of Systems Review of Systems  Constitutional: Negative for chills and fever.  Eyes: Positive for pain, discharge (purulent), redness (bilateral) and visual disturbance. Negative for photophobia and itching.  Neurological: Negative for headaches.     Physical Exam Updated Vital Signs BP 131/84 (BP Location: Right Arm)   Pulse 70   Temp 98.5 F (36.9 C) (Oral)   Resp 16   SpO2 100%   Physical Exam  Constitutional: She appears well-developed and well-nourished. No distress.  HENT:  Head: Normocephalic and atraumatic.  Eyes: Right eye exhibits no discharge. Left eye exhibits no discharge.  Visual acuity 20/50LE, 20/50RE Appearance. Conjunctival injection in bilateral eyes with grey purulent drainage. PERRL intact. No consensual photophobia. EOMI without nystagmus. Mild swelling over the right lower lid. No erythema or overlying pain.  Corneal Abrasion Exam VCO. Risks, benefits and alternatives explained. 2 drops of tetracaine (PONTOCAINE) 0.5 % ophthalmic solution were applied to bilateral eye. Fluorescein 1 MG ophthalmic strip applied the the surface of bilateral eyes.  Slit lamp used to screen for abrasion. No increased uptake. No corneal ulcer. No hyphema. No dendritic lesion. Negative Seidel sign.  No foreign bodies noted. No visible hyphema.  Patient tolerated the procedure well TONOPEN: 14RE, 12LE  Pulmonary/Chest: Effort normal. No respiratory distress.  Neurological: She is alert. Coordination normal.  Skin: She is not diaphoretic.  Psychiatric: She has a normal mood and affect. Her behavior is normal.  Nursing note and vitals reviewed.    ED Treatments / Results  Labs (all labs ordered are listed, but only abnormal results are displayed) Labs Reviewed - No data to  display  EKG None  Radiology No results found.  Procedures Procedures (including critical care time)  Medications Ordered in ED Medications  fluorescein ophthalmic strip 1 strip (1 strip Both Eyes Given 06/06/17 1914)  tetracaine (PONTOCAINE) 0.5 % ophthalmic solution 2 drop (2 drops Both Eyes Given 06/06/17 1914)     Initial Impression / Assessment and Plan / ED Course  I have reviewed the triage vital signs and the nursing notes.  Pertinent labs & imaging results that were available during my care of the patient were reviewed by me and considered in my medical decision making (see chart for details).     Sherri Hall presents with symptoms consistent with bacterial conjunctivitis.  Purulent discharge exam.  No corneal abrasions, entrapment, consensual photophobia, or dendritic staining with fluorescein study.  Presentation non-concerning for iritis, corneal abrasions, or HSV.  No evidence of preseptal or orbital cellulitis.  Pt did not improve with polytrim, will start ciprofloxacin to cover pseudomonas.  Personal hygiene and frequent handwashing discussed. Patient advised to followup with ophthalmologist for reevaluation in 48hrs. Counseled her on reasons to return to the ED. Patient verbalizes understanding and is agreeable with discharge.  Final Clinical Impressions(s) / ED Diagnoses   Final diagnoses:  Acute bacterial conjunctivitis of both eyes    ED Discharge Orders        Ordered    ciprofloxacin (CILOXAN) 0.3 % ophthalmic ointment     06/06/17 1935       Kellie Shropshire, PA-C 06/06/17 1943    Tegeler, Canary Brim, MD 06/07/17 [phone]

## 2017-06-06 NOTE — Discharge Instructions (Signed)
Please apply ointment to the lower lid 3 times daily for the first 2 days and then 2 times daily for the next 5 days after.  Please apply warm compresses to the eyes to help with drainage.  Follow-up with the eye doctor in 2 days.  His information is listed below.  Return to the emergency department if you have any new or concerning symptoms like redness and swelling around the eyes, fever greater than 100.4 F, trouble with your vision.

## 2017-06-06 NOTE — ED Triage Notes (Signed)
Pt c/o redness and swelling to bilateral eyes. States she was diagnosed with pink eye and has been using antibiotic drops since with no relief.

## 2018-02-18 ENCOUNTER — Emergency Department (HOSPITAL_COMMUNITY)
Admission: EM | Admit: 2018-02-18 | Discharge: 2018-02-18 | Disposition: A | Discharge status: Home / Self Care | Payer: Self-pay | Attending: Emergency Medicine | Admitting: Emergency Medicine

## 2018-02-18 ENCOUNTER — Encounter (HOSPITAL_COMMUNITY): Payer: Self-pay | Admitting: Emergency Medicine

## 2018-02-18 ENCOUNTER — Emergency Department (HOSPITAL_COMMUNITY): Payer: Self-pay

## 2018-02-18 DIAGNOSIS — Z79899 Other long term (current) drug therapy: Secondary | ICD-10-CM | POA: Insufficient documentation

## 2018-02-18 DIAGNOSIS — R0789 Other chest pain: Secondary | ICD-10-CM | POA: Insufficient documentation

## 2018-02-18 DIAGNOSIS — F17210 Nicotine dependence, cigarettes, uncomplicated: Secondary | ICD-10-CM | POA: Insufficient documentation

## 2018-02-18 LAB — CBC
HEMATOCRIT: 41.5 % (ref 36.0–46.0)
HEMOGLOBIN: 13.2 g/dL (ref 12.0–15.0)
MCH: 27.6 pg (ref 26.0–34.0)
MCHC: 31.8 g/dL (ref 30.0–36.0)
MCV: 86.6 fL (ref 80.0–100.0)
Platelets: 389 10*3/uL (ref 150–400)
RBC: 4.79 MIL/uL (ref 3.87–5.11)
RDW: 13.4 % (ref 11.5–15.5)
WBC: 7.7 10*3/uL (ref 4.0–10.5)
nRBC: 0 % (ref 0.0–0.2)

## 2018-02-18 LAB — BASIC METABOLIC PANEL
Anion gap: 8 (ref 5–15)
BUN: 8 mg/dL (ref 6–20)
CHLORIDE: 107 mmol/L (ref 98–111)
CO2: 23 mmol/L (ref 22–32)
CREATININE: 0.93 mg/dL (ref 0.44–1.00)
Calcium: 9.5 mg/dL (ref 8.9–10.3)
GFR calc Af Amer: >60 mL/min (ref >60)
GFR calc non Af Amer: >60 mL/min (ref >60)
Glucose, Bld: 115 mg/dL — ABNORMAL HIGH (ref 70–99)
POTASSIUM: 4 mmol/L (ref 3.5–5.1)
Sodium: 138 mmol/L (ref 135–145)

## 2018-02-18 LAB — I-STAT BETA HCG BLOOD, ED (MC, WL, AP ONLY)

## 2018-02-18 LAB — I-STAT TROPONIN, ED: Troponin i, poc: 0 ng/mL (ref 0.00–0.08)

## 2018-02-18 MED ORDER — ALUM & MAG HYDROXIDE-SIMETH 200-200-20 MG/5ML PO SUSP
30.0000 mL | Freq: Once | ORAL | Status: AC
  Administered 2018-02-18: 30 mL via ORAL
  Filled 2018-02-18: qty 30

## 2018-02-18 MED ORDER — IBUPROFEN 400 MG PO TABS
600.0000 mg | ORAL_TABLET | Freq: Once | ORAL | Status: AC
  Administered 2018-02-18: 600 mg via ORAL
  Filled 2018-02-18: qty 1

## 2018-02-18 MED ORDER — SODIUM CHLORIDE 0.9% FLUSH: 3.0000 mL | Freq: Once | INTRAVENOUS | Status: DC

## 2018-02-18 MED ORDER — LIDOCAINE VISCOUS HCL 2 % MT SOLN
15.0000 mL | Freq: Once | OROMUCOSAL | Status: AC
  Administered 2018-02-18: 15 mL via ORAL
  Filled 2018-02-18: qty 15

## 2018-02-18 NOTE — ED Provider Notes (Signed)
[facility] EMERGENCY DEPARTMENT Provider Note   CSN: [id number] Arrival date & time: 02/18/18  1331     History   Chief Complaint Chief Complaint  Patient presents with  . Chest Pain    HPI Sherri Hall is a 43 y.o. female.  Pt presents to the ED today with chest pain.  She said it started after drinking lemonade.  She thought it was her GERD, so she took a Nexium.  This did not help, so she came in.  She feels like something is "stuck," but is able to drink.     Past Medical History:  Diagnosis Date  . Acid reflux     There are no active problems to display for this patient.   Past Surgical History:  Procedure Laterality Date  . CHOLECYSTECTOMY       OB History   No obstetric history on file.      Home Medications    Prior to Admission medications   Medication Sig Start Date End Date Taking? Authorizing Provider  ciprofloxacin (CILOXAN) 0.3 % ophthalmic ointment Apply one half inch ribbon into lower lid three times daily for the first two days, then two times daily for the next five days 06/06/17   Kellie ShropshireShrosbree, Emily J, PA-C  famotidine (PEPCID) 20 MG tablet Take 1 tablet (20 mg total) by mouth 2 (two) times daily. 06/23/13   Junious SilkMerrell, Hannah, PA-C  predniSONE (DELTASONE) 20 MG tablet Take 2 tablets (40 mg total) by mouth daily. 06/23/13   Junious SilkMerrell, Hannah, PA-C    Family History No family history on file.  Social History Social History   Tobacco Use  . Smoking status: Current Every Day Smoker    Packs/day: 0.50    Types: Cigarettes  . Smokeless tobacco: Never Used  Substance Use Topics  . Alcohol use: No  . Drug use: No     Allergies   Patient has no known allergies.   Review of Systems Review of Systems  Cardiovascular: Positive for chest pain.  All other systems reviewed and are negative.    Physical Exam Updated Vital Signs LMP 01/18/2018   Physical Exam Vitals signs and nursing note reviewed.    Constitutional:      Appearance: She is well-developed. She is obese.  HENT:     Head: Normocephalic and atraumatic.  Eyes:     Extraocular Movements: Extraocular movements intact.     Pupils: Pupils are equal, round, and reactive to light.  Neck:     Musculoskeletal: Normal range of motion and neck supple.  Cardiovascular:     Rate and Rhythm: Normal rate and regular rhythm.     Heart sounds: Normal heart sounds.  Pulmonary:     Effort: Pulmonary effort is normal.     Breath sounds: Normal breath sounds.  Abdominal:     General: Bowel sounds are normal.     Palpations: Abdomen is soft.  Musculoskeletal: Normal range of motion.  Skin:    General: Skin is warm and dry.     Capillary Refill: Capillary refill takes less than 2 seconds.  Neurological:     General: No focal deficit present.     Mental Status: She is alert and oriented to person, place, and time.  Psychiatric:        Mood and Affect: Mood normal.        Behavior: Behavior normal.      ED Treatments / Results  Labs (all labs ordered are listed, but  only abnormal results are displayed) Labs Reviewed  BASIC METABOLIC PANEL - Abnormal; Notable for the following components:      Result Value   Glucose, Bld 115 (*)    All other components within normal limits  CBC  I-STAT TROPONIN, ED  I-STAT BETA HCG BLOOD, ED ([facility], [facility], [facility] ONLY)    EKG EKG Interpretation  Date/Time:  Thursday February 18 2018 13:36:54 EST Ventricular Rate:  78 PR Interval:  158 QRS Duration: 86 QT Interval:  380 QTC Calculation: 433 R Axis:   100 Text Interpretation:  Normal sinus rhythm Rightward axis Borderline ECG No significant change since last tracing Confirmed by Jacalyn Lefevre [phone]) on 02/18/2018 2:38:07 PM Also confirmed by Jacalyn Lefevre [phone]), editor Josephine Igo ([id number])  on 02/18/2018 3:27:25 PM   Radiology Dg Chest 2 View  Result Date: 02/18/2018 CLINICAL DATA:  Substernal chest pain and short of breath EXAM:  CHEST - 2 VIEW COMPARISON:  10/06/2007 FINDINGS: Heart size and vascularity normal. Mild bibasilar atelectasis. No focal pneumonia or effusion. Negative for mass or adenopathy. IMPRESSION: Mild bibasilar atelectasis. Electronically Signed   By: Marlan Palau M.D.   On: 02/18/2018 14:17    Procedures Procedures (including critical care time)  Medications Ordered in ED Medications  sodium chloride flush (NS) 0.9 % injection 3 mL (has no administration in time range)  alum & mag hydroxide-simeth (MAALOX/MYLANTA) 200-200-20 MG/5ML suspension 30 mL (has no administration in time range)    And  lidocaine (XYLOCAINE) 2 % viscous mouth solution 15 mL (has no administration in time range)  ibuprofen (ADVIL,MOTRIN) tablet 600 mg (has no administration in time range)     Initial Impression / Assessment and Plan / ED Course  I have reviewed the triage vital signs and the nursing notes.  Pertinent labs & imaging results that were available during my care of the patient were reviewed by me and considered in my medical decision making (see chart for details).    Pt is feeling better.  Heart score is 0.  Pt is stable for d/c.  She knows to return if worse and to f/u with pcp.  Final Clinical Impressions(s) / ED Diagnoses   Final diagnoses:  Atypical chest pain    ED Discharge Orders    None       Jacalyn Lefevre, MD 02/18/18 1546

## 2018-02-18 NOTE — Discharge Instructions (Signed)
Continue taking nexium

## 2018-02-18 NOTE — ED Triage Notes (Signed)
Pt with substernal chest pain and sob since yesterday evening. She reports hx of GERD and took antiacids with no relief. She denies radiation or cardiac hx. NAD at triage.

## 2018-02-23 ENCOUNTER — Emergency Department (HOSPITAL_COMMUNITY)
Admission: EM | Admit: 2018-02-23 | Discharge: 2018-02-23 | Disposition: A | Discharge status: Home / Self Care | Payer: Self-pay | Attending: Emergency Medicine | Admitting: Emergency Medicine

## 2018-02-23 DIAGNOSIS — Z5321 Procedure and treatment not carried out due to patient leaving prior to being seen by health care provider: Secondary | ICD-10-CM | POA: Insufficient documentation

## 2018-02-23 DIAGNOSIS — R0789 Other chest pain: Secondary | ICD-10-CM | POA: Insufficient documentation

## 2018-02-23 NOTE — ED Triage Notes (Signed)
Pt endorses left lower ribcage pain. Here Thursday for same and given GI cocktail with complete relief. Pt has hx of gerd. Pt now has cold sx. VSS. Denies CP or shob.

## 2018-02-23 NOTE — ED Notes (Signed)
Pt called for room, no answer.

## 2018-04-01 ENCOUNTER — Ambulatory Visit: Payer: Self-pay | Admitting: Family Medicine

## 2019-01-24 ENCOUNTER — Ambulatory Visit: Payer: HRSA Program | Attending: Internal Medicine

## 2019-01-24 DIAGNOSIS — Z20822 Contact with and (suspected) exposure to covid-19: Secondary | ICD-10-CM

## 2019-01-24 DIAGNOSIS — Z20828 Contact with and (suspected) exposure to other viral communicable diseases: Secondary | ICD-10-CM | POA: Insufficient documentation

## 2019-01-25 ENCOUNTER — Ambulatory Visit (HOSPITAL_COMMUNITY)
Admission: EM | Admit: 2019-01-25 | Discharge: 2019-01-25 | Disposition: A | Discharge status: Home / Self Care | Payer: Self-pay | Attending: Family Medicine | Admitting: Family Medicine

## 2019-01-25 ENCOUNTER — Encounter (HOSPITAL_COMMUNITY): Payer: Self-pay

## 2019-01-25 ENCOUNTER — Other Ambulatory Visit: Payer: Self-pay

## 2019-01-25 DIAGNOSIS — N898 Other specified noninflammatory disorders of vagina: Secondary | ICD-10-CM | POA: Insufficient documentation

## 2019-01-25 MED ORDER — FLUCONAZOLE 150 MG PO TABS: 150.0000 mg | ORAL_TABLET | Freq: Every day | ORAL | 0 refills | Status: DC

## 2019-01-25 NOTE — Discharge Instructions (Addendum)
Treating you for a yeast infection with fluconazole.  You can take 1 tab now and then 1 in 3 days if you are still having symptoms. We will send your swab for testing and call you with any positive results.

## 2019-01-25 NOTE — ED Provider Notes (Signed)
[facility]    CSN: [id number] Arrival date & time: 01/25/19  0802      History   Chief Complaint Chief Complaint  Patient presents with  . Vaginal Discharge    HPI Sherri Hall is a 43 y.o. female.   Patient is a otherwise healthy 43 year old female presents today with approximate 3 days of vaginal discharge, itching and irritation.  Describes the discharge as thick, white.  Tried Monistat over-the-counter without much relief.  Denies any associated abdominal pain, back pain, fever, dysuria, hematuria or urinary frequency.  No concern for STDs or new sexual partners. Patient's last menstrual period was 01/03/2019.  ROS per HPI    Vaginal Discharge   Past Medical History:  Diagnosis Date  . Acid reflux     There are no problems to display for this patient.   Past Surgical History:  Procedure Laterality Date  . CHOLECYSTECTOMY      OB History   No obstetric history on file.      Home Medications    Prior to Admission medications   Medication Sig Start Date End Date Taking? Authorizing Provider  fluconazole (DIFLUCAN) 150 MG tablet Take 1 tablet (150 mg total) by mouth daily. 01/25/19   Dahlia Byes A, NP  famotidine (PEPCID) 20 MG tablet Take 1 tablet (20 mg total) by mouth 2 (two) times daily. 06/23/13 01/25/19  Junious Silk, PA-C    Family History History reviewed. No pertinent family history.  Social History Social History   Tobacco Use  . Smoking status: Current Every Day Smoker    Packs/day: 0.50    Types: Cigarettes  . Smokeless tobacco: Never Used  Substance Use Topics  . Alcohol use: No  . Drug use: No     Allergies   Patient has no known allergies.   Review of Systems Review of Systems  Genitourinary: Positive for vaginal discharge.     Physical Exam Triage Vital Signs ED Triage Vitals  Enc Vitals Group     BP 01/25/19 0837 (!) 179/149     Pulse Rate 01/25/19 0837 83     Resp 01/25/19 0837 18     Temp  01/25/19 0837 98.1 F (36.7 C)     Temp Source 01/25/19 0837 Oral     SpO2 01/25/19 0837 98 %     Weight 01/25/19 0834 207 lb (93.9 kg)     Height --      Head Circumference --      Peak Flow --      Pain Score 01/25/19 0834 5     Pain Loc --      Pain Edu? --      Excl. in GC? --    No data found.  Updated Vital Signs BP (!) 179/149 (BP Location: Right Arm)   Pulse 83   Temp 98.1 F (36.7 C) (Oral)   Resp 18   Wt 207 lb (93.9 kg)   LMP 01/03/2019   SpO2 98%   Visual Acuity Right Eye Distance:   Left Eye Distance:   Bilateral Distance:    Right Eye Near:   Left Eye Near:    Bilateral Near:     Physical Exam Vitals and nursing note reviewed.  Constitutional:      General: She is not in acute distress.    Appearance: Normal appearance. She is not ill-appearing, toxic-appearing or diaphoretic.  HENT:     Head: Normocephalic.     Nose: Nose normal.  Mouth/Throat:     Pharynx: Oropharynx is clear.  Eyes:     Conjunctiva/sclera: Conjunctivae normal.  Pulmonary:     Effort: Pulmonary effort is normal.  Abdominal:     Palpations: Abdomen is soft.     Tenderness: There is no abdominal tenderness.  Musculoskeletal:        General: Normal range of motion.     Cervical back: Normal range of motion.  Skin:    General: Skin is warm and dry.     Findings: No rash.  Neurological:     Mental Status: She is alert.  Psychiatric:        Mood and Affect: Mood normal.      UC Treatments / Results  Labs (all labs ordered are listed, but only abnormal results are displayed) Labs Reviewed  CERVICOVAGINAL ANCILLARY ONLY    EKG   Radiology No results found.  Procedures Procedures (including critical care time)  Medications Ordered in UC Medications - No data to display  Initial Impression / Assessment and Plan / UC Course  I have reviewed the triage vital signs and the nursing notes.  Pertinent labs & imaging results that were available during my care of  the patient were reviewed by me and considered in my medical decision making (see chart for details).     Vaginal discharge-treating for yeast infection based on symptoms Sending swab for testing with labs pending Final Clinical Impressions(s) / UC Diagnoses   Final diagnoses:  Vaginal discharge     Discharge Instructions     Treating you for a yeast infection with fluconazole.  You can take 1 tab now and then 1 in 3 days if you are still having symptoms. We will send your swab for testing and call you with any positive results.    ED Prescriptions    Medication Sig Dispense Auth. Provider   fluconazole (DIFLUCAN) 150 MG tablet Take 1 tablet (150 mg total) by mouth daily. 2 tablet Loura Halt A, NP     PDMP not reviewed this encounter.   Orvan July, NP 01/25/19 1036

## 2019-01-25 NOTE — ED Triage Notes (Signed)
Pt states she has a yeast infection x 3 days. Pt state she has itching and burning.

## 2019-01-26 LAB — NOVEL CORONAVIRUS, NAA: SARS-CoV-2, NAA: NOT DETECTED

## 2019-01-27 ENCOUNTER — Telehealth: Payer: Self-pay | Admitting: General Practice

## 2019-01-27 ENCOUNTER — Telehealth (HOSPITAL_COMMUNITY): Payer: Self-pay | Admitting: Emergency Medicine

## 2019-01-27 LAB — CERVICOVAGINAL ANCILLARY ONLY
Bacterial vaginitis: POSITIVE — AB
Candida vaginitis: POSITIVE — AB
Chlamydia: NEGATIVE
Neisseria Gonorrhea: NEGATIVE
Trichomonas: NEGATIVE

## 2019-01-27 MED ORDER — METRONIDAZOLE 500 MG PO TABS: 500.0000 mg | ORAL_TABLET | Freq: Two times a day (BID) | ORAL | 0 refills | Status: AC

## 2019-01-27 NOTE — Telephone Encounter (Signed)
Negative COVID results given. Patient results "NOT Detected." Caller expressed understanding. ° °

## 2019-01-27 NOTE — Telephone Encounter (Signed)
Bacterial vaginosis is positive. This was not treated at the urgent care visit.  Flagyl 500 mg BID x 7 days #14 no refills sent to patients pharmacy of choice.    Candida (yeast) is positive.  Prescription for fluconazole was given at the urgent care visit.    Patient contacted by phone and made aware of    results. Pt verbalized understanding and had all questions answered.    

## 2019-02-17 ENCOUNTER — Ambulatory Visit: Payer: Self-pay | Attending: Internal Medicine

## 2019-02-17 DIAGNOSIS — Z20822 Contact with and (suspected) exposure to covid-19: Secondary | ICD-10-CM | POA: Insufficient documentation

## 2019-02-18 LAB — NOVEL CORONAVIRUS, NAA: SARS-CoV-2, NAA: NOT DETECTED

## 2019-08-20 ENCOUNTER — Other Ambulatory Visit: Payer: Self-pay

## 2019-08-20 ENCOUNTER — Encounter (HOSPITAL_COMMUNITY): Payer: Self-pay | Admitting: *Deleted

## 2019-08-20 ENCOUNTER — Ambulatory Visit (HOSPITAL_COMMUNITY)
Admission: EM | Admit: 2019-08-20 | Discharge: 2019-08-20 | Disposition: A | Discharge status: Home / Self Care | Payer: Self-pay

## 2019-08-20 DIAGNOSIS — K122 Cellulitis and abscess of mouth: Secondary | ICD-10-CM

## 2019-08-20 DIAGNOSIS — K1379 Other lesions of oral mucosa: Secondary | ICD-10-CM

## 2019-08-20 MED ORDER — CLINDAMYCIN HCL 150 MG PO CAPS: 150.0000 mg | ORAL_CAPSULE | Freq: Three times a day (TID) | ORAL | 0 refills | Status: AC

## 2019-08-20 MED ORDER — CHLORHEXIDINE GLUCONATE 0.12 % MT SOLN: 15.0000 mL | Freq: Two times a day (BID) | OROMUCOSAL | 0 refills | Status: DC

## 2019-08-20 MED ORDER — HYDROCODONE-ACETAMINOPHEN 5-325 MG PO TABS: 1.0000 | ORAL_TABLET | ORAL | 0 refills | Status: DC | PRN

## 2019-08-20 NOTE — ED Triage Notes (Signed)
C/O "knot" to hard palate x 2 days with swelling into left face/nasal area.  Denies fevers.

## 2019-08-20 NOTE — Discharge Instructions (Signed)
Take the antibiotic as prescribed.    A dental resource guide is attached.  Please call to make an appointment with a dentist as soon as possible.    Go to the emergency department if you have acute worsening symptoms.     

## 2019-08-20 NOTE — ED Provider Notes (Signed)
[facility]   [id number] 08/20/19 Arrival Time: 1636  CC: DENTAL pain  SUBJECTIVE:  Sherri Hall is a 44 y.o. female who presents with dental pain for the last 3 days.  Reports that she has a dentist appointment next week.  Reports that she has had a broken tooth in the past.  She reports pain in the right upper jaw..  Has tried OTC analgesics without relief. Worse with chewing. Does not see a dentist regularly. Reports similar symptoms in the past. Denies fever, chills, dysphagia, odynophagia, oral or neck swelling, nausea, vomiting, chest pain, SOB.    ROS: As per HPI.  All other pertinent ROS negative.     Past Medical History:  Diagnosis Date  . Acid reflux    Past Surgical History:  Procedure Laterality Date  . CHOLECYSTECTOMY     No Known Allergies No current facility-administered medications on file prior to encounter.   Current Outpatient Medications on File Prior to Encounter  Medication Sig Dispense Refill  . Acetaminophen (TYLENOL PO) Take by mouth.    . IBUPROFEN PO Take by mouth.    . Naproxen Sodium (ALEVE PO) Take by mouth.    . fluconazole (DIFLUCAN) 150 MG tablet Take 1 tablet (150 mg total) by mouth daily. 2 tablet 0  . [DISCONTINUED] famotidine (PEPCID) 20 MG tablet Take 1 tablet (20 mg total) by mouth 2 (two) times daily. 30 tablet 0   Social History   Socioeconomic History  . Marital status: Single    Spouse name: Not on file  . Number of children: Not on file  . Years of education: Not on file  . Highest education level: Not on file  Occupational History  . Not on file  Tobacco Use  . Smoking status: Current Every Day Smoker    Packs/day: 0.50    Types: Cigarettes  . Smokeless tobacco: Never Used  Vaping Use  . Vaping Use: Never used  Substance and Sexual Activity  . Alcohol use: No  . Drug use: No  . Sexual activity: Not Currently  Other Topics Concern  . Not on file  Social History Narrative  . Not on file   Social  Determinants of Health   Financial Resource Strain:   . Difficulty of Paying Living Expenses:   Food Insecurity:   . Worried About Programme researcher, broadcasting/film/video in the Last Year:   . Barista in the Last Year:   Transportation Needs:   . Freight forwarder (Medical):   Marland Kitchen Lack of Transportation (Non-Medical):   Physical Activity:   . Days of Exercise per Week:   . Minutes of Exercise per Session:   Stress:   . Feeling of Stress :   Social Connections:   . Frequency of Communication with Friends and Family:   . Frequency of Social Gatherings with Friends and Family:   . Attends Religious Services:   . Active Member of Clubs or Organizations:   . Attends Banker Meetings:   Marland Kitchen Marital Status:   Intimate Partner Violence:   . Fear of Current or Ex-Partner:   . Emotionally Abused:   Marland Kitchen Physically Abused:   . Sexually Abused:    Family History  Problem Relation Age of Onset  . Healthy Mother   . Seizures Father   . Congestive Heart Failure Father     OBJECTIVE:  Vitals:   08/20/19 1658  BP: 117/77  Pulse: 80  Resp: 18  Temp: 98.3  F (36.8 C)  TempSrc: Oral  SpO2: 99%    General appearance: alert; no distress HENT: normocephalic; atraumatic; dentition: fair; edentulous over right upper gums without areas of fluctuance Neck: supple without LAD Lungs: normal respirations Skin: warm and dry Psychological: alert and cooperative; normal mood and affect  ASSESSMENT & PLAN:  1. Cellulitis and abscess of mouth   2. Mouth pain     Meds ordered this encounter  Medications  . chlorhexidine (PERIDEX) 0.12 % solution    Sig: Use as directed 15 mLs in the mouth or throat 2 (two) times daily.    Dispense:  120 mL    Refill:  0    Order Specific Question:   Supervising Provider    Answer:   Merrilee Jansky [id number]  . clindamycin (CLEOCIN) 150 MG capsule    Sig: Take 1 capsule (150 mg total) by mouth 3 (three) times daily for 7 days.    Dispense:  21  capsule    Refill:  0    Order Specific Question:   Supervising Provider    Answer:   Merrilee Jansky [id number]  . HYDROcodone-acetaminophen (NORCO/VICODIN) 5-325 MG tablet    Sig: Take 1 tablet by mouth every 4 (four) hours as needed for moderate pain or severe pain.    Dispense:  15 tablet    Refill:  0    Order Specific Question:   Supervising Provider    Answer:   Merrilee Jansky [[id number]]    Chlorhexidine prescribed  Clindamycin prescribed Take antibiotic as directed and to completion Norco prescribed Use as directed for pain relief Recommend soft diet until evaluated by dentist Maintain oral hygiene care Follow up with dentist as soon as possible for further evaluation and treatment  Return or go to the ED if you have any new or worsening symptoms such as fever, chills, difficulty swallowing, painful swallowing, oral or neck swelling, nausea, vomiting, chest pain, SOB.  Reviewed PDMP this encounter  Reviewed expectations re: course of current medical issues. Questions answered. Outlined signs and symptoms indicating need for more acute intervention. Patient verbalized understanding. After Visit Summary given.    Moshe Cipro, NP 08/21/19 1019

## 2020-11-17 ENCOUNTER — Encounter (HOSPITAL_COMMUNITY): Payer: Self-pay

## 2020-11-17 ENCOUNTER — Ambulatory Visit (HOSPITAL_COMMUNITY)
Admission: EM | Admit: 2020-11-17 | Discharge: 2020-11-17 | Disposition: A | Discharge status: Home / Self Care | Payer: Self-pay | Attending: Family Medicine | Admitting: Family Medicine

## 2020-11-17 ENCOUNTER — Other Ambulatory Visit: Payer: Self-pay

## 2020-11-17 DIAGNOSIS — N760 Acute vaginitis: Secondary | ICD-10-CM | POA: Insufficient documentation

## 2020-11-17 DIAGNOSIS — J069 Acute upper respiratory infection, unspecified: Secondary | ICD-10-CM | POA: Insufficient documentation

## 2020-11-17 MED ORDER — PREDNISONE 20 MG PO TABS: 40.0000 mg | ORAL_TABLET | Freq: Every day | ORAL | 0 refills | Status: DC

## 2020-11-17 MED ORDER — FLUTICASONE PROPIONATE 50 MCG/ACT NA SUSP: 1.0000 | Freq: Two times a day (BID) | NASAL | 0 refills | Status: DC

## 2020-11-17 MED ORDER — METRONIDAZOLE 500 MG PO TABS: 500.0000 mg | ORAL_TABLET | Freq: Two times a day (BID) | ORAL | 0 refills | Status: DC

## 2020-11-17 MED ORDER — FLUCONAZOLE 150 MG PO TABS: 150.0000 mg | ORAL_TABLET | ORAL | 0 refills | Status: DC

## 2020-11-17 NOTE — ED Triage Notes (Signed)
Pt reports white vaginal discharge x 2 days.   Pt reports nasal congestion x 4 days.

## 2020-11-17 NOTE — ED Provider Notes (Signed)
[facility]    CSN: [id number] Arrival date & time: 11/17/20  1006      History   Chief Complaint Chief Complaint  Patient presents with   Vaginal Discharge   Nasal Congestion         HPI Sherri Hall is a 45 y.o. female.   Patient presenting today with multiple complaints.  She states that she has had nasal congestion, productive cough for about 4 days now.  Has been taking Alka-Seltzer cold and sinus with minimal temporary relief.  Denies fever, chills, body aches, chest pain, shortness of breath, abdominal pain, nausea vomiting or diarrhea.  No known history of seasonal allergies, asthma or other relevant issues.  Daughter sick with similar symptoms.  Also having vaginal discharge, irritation, itching the past 2 days.  Has been trying Monistat over-the-counter with minimal relief.  No new sexual partners or concern for sexually transmitted infections.  No concern for pregnancy.  History of yeast infections and BV.   Past Medical History:  Diagnosis Date   Acid reflux     There are no problems to display for this patient.   Past Surgical History:  Procedure Laterality Date   CHOLECYSTECTOMY      OB History   No obstetric history on file.      Home Medications    Prior to Admission medications   Medication Sig Start Date End Date Taking? Authorizing Provider  fluconazole (DIFLUCAN) 150 MG tablet Take 1 tablet (150 mg total) by mouth every other day. 11/17/20  Yes Particia Nearing, PA-C  fluticasone [facility]) 50 MCG/ACT nasal spray Place 1 spray into both nostrils 2 (two) times daily. 11/17/20  Yes Particia Nearing, PA-C  metroNIDAZOLE (FLAGYL) 500 MG tablet Take 1 tablet (500 mg total) by mouth 2 (two) times daily. 11/17/20  Yes Particia Nearing, PA-C  predniSONE (DELTASONE) 20 MG tablet Take 2 tablets (40 mg total) by mouth daily with breakfast. 11/17/20  Yes Particia Nearing, PA-C  Acetaminophen (TYLENOL PO) Take by  mouth.    [provider]  chlorhexidine (PERIDEX) 0.12 % solution Use as directed 15 mLs in the mouth or throat 2 (two) times daily. 08/20/19   Moshe Cipro, NP  fluconazole (DIFLUCAN) 150 MG tablet Take 1 tablet (150 mg total) by mouth daily. 01/25/19   Dahlia Byes A, NP  HYDROcodone-acetaminophen (NORCO/VICODIN) 5-325 MG tablet Take 1 tablet by mouth every 4 (four) hours as needed for moderate pain or severe pain. 08/20/19   Moshe Cipro, NP  IBUPROFEN PO Take by mouth.    [provider]  Naproxen Sodium (ALEVE PO) Take by mouth.    [provider]  famotidine (PEPCID) 20 MG tablet Take 1 tablet (20 mg total) by mouth 2 (two) times daily. 06/23/13 01/25/19  Junious Silk, PA-C    Family History Family History  Problem Relation Age of Onset   Healthy Mother    Seizures Father    Congestive Heart Failure Father     Social History Social History   Tobacco Use   Smoking status: Every Day    Packs/day: 0.50    Types: Cigarettes   Smokeless tobacco: Never  Vaping Use   Vaping Use: Never used  Substance Use Topics   Alcohol use: No   Drug use: No     Allergies   Patient has no known allergies.   Review of Systems Review of Systems Per HPI  Physical Exam Triage Vital Signs ED Triage Vitals  Enc Vitals Group     BP 11/17/20 1030 135/83     Pulse Rate 11/17/20 1030 91     Resp 11/17/20 1030 18     Temp 11/17/20 1030 98.1 F (36.7 C)     Temp Source 11/17/20 1030 Oral     SpO2 11/17/20 1030 97 %     Weight --      Height --      Head Circumference --      Peak Flow --      Pain Score 11/17/20 1029 0     Pain Loc --      Pain Edu? --      Excl. in [place]? --    No data found.  Updated Vital Signs BP 135/83 (BP Location: Right Arm)   Pulse 91   Temp 98.1 F (36.7 C) (Oral)   Resp 18   SpO2 97%   Visual Acuity Right Eye Distance:   Left Eye Distance:   Bilateral Distance:    Right Eye Near:   Left Eye Near:     Bilateral Near:     Physical Exam Vitals and nursing note reviewed.  Constitutional:      Appearance: Normal appearance. She is not ill-appearing.  HENT:     Head: Atraumatic.     Right Ear: Tympanic membrane normal.     Left Ear: Tympanic membrane normal.     Nose: Rhinorrhea present.     Mouth/Throat:     Mouth: Mucous membranes are moist.     Pharynx: Posterior oropharyngeal erythema present.  Eyes:     Extraocular Movements: Extraocular movements intact.     Conjunctiva/sclera: Conjunctivae normal.  Cardiovascular:     Rate and Rhythm: Normal rate and regular rhythm.     Heart sounds: Normal heart sounds.  Pulmonary:     Effort: Pulmonary effort is normal. No respiratory distress.     Breath sounds: Normal breath sounds. No wheezing or rales.  Abdominal:     General: Bowel sounds are normal. There is no distension.     Palpations: Abdomen is soft.     Tenderness: There is no abdominal tenderness. There is no guarding.  Genitourinary:    Comments: GU exam deferred, self swab performed Musculoskeletal:        General: Normal range of motion.     Cervical back: Normal range of motion and neck supple.  Skin:    General: Skin is warm and dry.  Neurological:     Mental Status: She is alert and oriented to person, place, and time.  Psychiatric:        Mood and Affect: Mood normal.        Thought Content: Thought content normal.        Judgment: Judgment normal.     UC Treatments / Results  Labs (all labs ordered are listed, but only abnormal results are displayed) Labs Reviewed  CERVICOVAGINAL ANCILLARY ONLY    EKG   Radiology No results found.  Procedures Procedures (including critical care time)  Medications Ordered in UC Medications - No data to display  Initial Impression / Assessment and Plan / UC Course  I have reviewed the triage vital signs and the nursing notes.  Pertinent labs & imaging results that were available during my care of the patient  were reviewed by me and considered in my medical decision making (see chart for details).     Vitals and exam overall reassuring, suspect viral upper respiratory infection  causing her upper respiratory symptoms.  She declines viral testing today.  We will treat with prednisone, Flonase, Mucinex, sinus rinses.  Will empirically treat for yeast and bacterial vaginosis while awaiting vaginal swab results given the nature of her symptoms and her history of these.  Adjust as needed based on swab results.  Return for acutely worsening symptoms.  Final Clinical Impressions(s) / UC Diagnoses   Final diagnoses:  Acute vaginitis  Viral URI with cough   Discharge Instructions   None    ED Prescriptions     Medication Sig Dispense Auth. Provider   predniSONE (DELTASONE) 20 MG tablet Take 2 tablets (40 mg total) by mouth daily with breakfast. 10 tablet Particia Nearing, PA-C   fluticasone [facility]) 50 MCG/ACT nasal spray Place 1 spray into both nostrils 2 (two) times daily. 16 g Particia Nearing, [place]   fluconazole (DIFLUCAN) 150 MG tablet Take 1 tablet (150 mg total) by mouth every other day. 3 tablet Particia Nearing, PA-C   metroNIDAZOLE (FLAGYL) 500 MG tablet Take 1 tablet (500 mg total) by mouth 2 (two) times daily. 14 tablet Particia Nearing, [place]      PDMP not reviewed this encounter.   Particia Nearing, [place] 11/17/20 1138

## 2020-11-19 LAB — CERVICOVAGINAL ANCILLARY ONLY
Bacterial Vaginitis (gardnerella): NEGATIVE
Candida Glabrata: NEGATIVE
Candida Vaginitis: POSITIVE — AB
Chlamydia: NEGATIVE
Comment: NEGATIVE
Comment: NEGATIVE
Comment: NEGATIVE
Comment: NEGATIVE
Comment: NEGATIVE
Comment: NORMAL
Neisseria Gonorrhea: NEGATIVE
Trichomonas: NEGATIVE

## 2020-12-08 ENCOUNTER — Ambulatory Visit (HOSPITAL_COMMUNITY)
Admission: EM | Admit: 2020-12-08 | Discharge: 2020-12-08 | Disposition: A | Discharge status: Home / Self Care | Payer: Self-pay | Attending: Medical Oncology | Admitting: Medical Oncology

## 2020-12-08 ENCOUNTER — Encounter (HOSPITAL_COMMUNITY): Payer: Self-pay

## 2020-12-08 ENCOUNTER — Other Ambulatory Visit: Payer: Self-pay

## 2020-12-08 DIAGNOSIS — N898 Other specified noninflammatory disorders of vagina: Secondary | ICD-10-CM | POA: Insufficient documentation

## 2020-12-08 DIAGNOSIS — R81 Glycosuria: Secondary | ICD-10-CM | POA: Insufficient documentation

## 2020-12-08 LAB — POCT URINALYSIS DIPSTICK, ED / UC
Bilirubin Urine: NEGATIVE
Glucose, UA: >=1000 mg/dL — AB
Leukocytes,Ua: NEGATIVE
Nitrite: NEGATIVE
Protein, ur: NEGATIVE mg/dL
Specific Gravity, Urine: 1.025 (ref 1.005–1.030)
Urobilinogen, UA: 0.2 mg/dL (ref 0.0–1.0)
pH: 5.5 (ref 5.0–8.0)

## 2020-12-08 LAB — CBG MONITORING, ED: Glucose-Capillary: 343 mg/dL — ABNORMAL HIGH (ref 70–99)

## 2020-12-08 MED ORDER — METFORMIN HCL 500 MG PO TABS: 500.0000 mg | ORAL_TABLET | Freq: Two times a day (BID) | ORAL | 0 refills | Status: DC

## 2020-12-08 MED ORDER — BLOOD GLUCOSE MONITOR KIT: PACK | 0 refills | Status: AC

## 2020-12-08 NOTE — ED Provider Notes (Signed)
[name]    CSN: [id number] Arrival date & time: 12/08/20  1005     History   Chief Complaint Chief Complaint  Patient presents with   Vaginal Itching    HPI Sherri Hall is a 45 y.o. female.   HPI  Vaginal Itching: Pt reports that for the past 2 weeks patient has had vaginal itching. Previously was treated for vaginal candidiasis and bacterial vaginosis but symptoms returned within days. Pt reports that she has tried OTC medications such as Monistat without relief. She denies fevers, pelvic pain, but has had some urinary urgency, frequency, mild low back pain and mild dysuria. LMP: finished 2 days ago.   Past Medical History:  Diagnosis Date   Acid reflux     There are no problems to display for this patient.   Past Surgical History:  Procedure Laterality Date   CHOLECYSTECTOMY      OB History   No obstetric history on file.      Home Medications    Prior to Admission medications   Medication Sig Start Date End Date Taking? Authorizing Provider  blood glucose meter kit and supplies KIT Dispense based on patient and insurance preference. Use up to four times daily as directed. 12/08/20  Yes Cheyenna Pankowski, Judson Roch M, PA-C  metFORMIN (GLUCOPHAGE) 500 MG tablet Take 1 tablet (500 mg total) by mouth 2 (two) times daily with a meal. 12/08/20  Yes Lynnelle Mesmer M, PA-C  Acetaminophen (TYLENOL PO) Take by mouth.    [provider]  chlorhexidine (PERIDEX) 0.12 % solution Use as directed 15 mLs in the mouth or throat 2 (two) times daily. 08/20/19   Faustino Congress, NP  fluconazole (DIFLUCAN) 150 MG tablet Take 1 tablet (150 mg total) by mouth daily. 01/25/19   Loura Halt A, NP  fluconazole (DIFLUCAN) 150 MG tablet Take 1 tablet (150 mg total) by mouth every other day. 11/17/20   Volney American, PA-C  fluticasone [facility]) 50 MCG/ACT nasal spray Place 1 spray into both nostrils 2 (two) times daily. 11/17/20   Volney American,  PA-C  HYDROcodone-acetaminophen (NORCO/VICODIN) 5-325 MG tablet Take 1 tablet by mouth every 4 (four) hours as needed for moderate pain or severe pain. 08/20/19   Faustino Congress, NP  IBUPROFEN PO Take by mouth.    [provider]  metroNIDAZOLE (FLAGYL) 500 MG tablet Take 1 tablet (500 mg total) by mouth 2 (two) times daily. 11/17/20   Volney American, PA-C  Naproxen Sodium (ALEVE PO) Take by mouth.    [provider]  predniSONE (DELTASONE) 20 MG tablet Take 2 tablets (40 mg total) by mouth daily with breakfast. 11/17/20   Volney American, PA-C  famotidine (PEPCID) 20 MG tablet Take 1 tablet (20 mg total) by mouth 2 (two) times daily. 06/23/13 01/25/19  Cleatrice Burke, PA-C    Family History Family History  Problem Relation Age of Onset   Healthy Mother    Seizures Father    Congestive Heart Failure Father     Social History Social History   Tobacco Use   Smoking status: Every Day    Packs/day: 0.50    Types: Cigarettes   Smokeless tobacco: Never  Vaping Use   Vaping Use: Never used  Substance Use Topics   Alcohol use: No   Drug use: No     Allergies   Patient has no known allergies.   Review of Systems Review of Systems  As stated above in HPI Physical  Exam Triage Vital Signs ED Triage Vitals  Enc Vitals Group     BP 12/08/20 1024 118/80     Pulse Rate 12/08/20 1024 74     Resp 12/08/20 1024 18     Temp 12/08/20 1024 98.4 F (36.9 C)     Temp Source 12/08/20 1024 Oral     SpO2 12/08/20 1024 96 %     Weight --      Height --      Head Circumference --      Peak Flow --      Pain Score 12/08/20 1023 0     Pain Loc --      Pain Edu? --      Excl. in [place]? --    No data found.  Updated Vital Signs BP 118/80 (BP Location: Right Arm)   Pulse 74   Temp 98.4 F (36.9 C) (Oral)   Resp 18   SpO2 96%   Physical Exam Vitals and nursing note reviewed.  Constitutional:      General: She is not in acute distress.     Appearance: Normal appearance. She is not ill-appearing, toxic-appearing or diaphoretic.  Cardiovascular:     Rate and Rhythm: Normal rate and regular rhythm.     Heart sounds: Normal heart sounds.  Pulmonary:     Effort: Pulmonary effort is normal.     Breath sounds: Normal breath sounds.  Abdominal:     General: Abdomen is flat. Bowel sounds are normal. There is no distension.     Palpations: Abdomen is soft. There is no mass.     Tenderness: There is no abdominal tenderness. There is no right CVA tenderness, left CVA tenderness, guarding or rebound.     Hernia: No hernia is present.  Musculoskeletal:     Cervical back: Neck supple.  Lymphadenopathy:     Cervical: No cervical adenopathy.  Skin:    General: Skin is warm.  Neurological:     Mental Status: She is alert and oriented to person, place, and time.     UC Treatments / Results  Labs (all labs ordered are listed, but only abnormal results are displayed) Labs Reviewed  POCT URINALYSIS DIPSTICK, ED / UC - Abnormal; Notable for the following components:      Result Value   Glucose, UA >=1000 (*)    Ketones, ur TRACE (*)    Hgb urine dipstick SMALL (*)    All other components within normal limits  CBG MONITORING, ED - Abnormal; Notable for the following components:   Glucose-Capillary 343 (*)    All other components within normal limits  CERVICOVAGINAL ANCILLARY ONLY    EKG   Radiology No results found.  Procedures Procedures (including critical care time)  Medications Ordered in UC Medications - No data to display  Initial Impression / Assessment and Plan / UC Course  I have reviewed the triage vital signs and the nursing notes.  Pertinent labs & imaging results that were available during my care of the patient were reviewed by me and considered in my medical decision making (see chart for details).     New. Vaginal discharge. We have elected to wait for swab results prior to treatment to ensure we are  treating correctly. In terms of her other symptoms we are going to perform a UA to ensure no sign of UTI. Discussed red flag signs and symptoms.   UPDATE: Given UA results we checked a fasting glucose which was elevated. She  does not appear to be in DKA but we discussed red flag signs and symptoms of this with low threshold for going to the ER. For now hydration with water, strict low carb diet, metformin and glucose monitoring. Discussed diabetic diet. Primary care follow up recommended.    Final Clinical Impressions(s) / UC Diagnoses   Final diagnoses:  Vaginal itching  Glucosuria   Discharge Instructions   None    ED Prescriptions     Medication Sig Dispense Auth. Provider   metFORMIN (GLUCOPHAGE) 500 MG tablet Take 1 tablet (500 mg total) by mouth 2 (two) times daily with a meal. 60 tablet Martell Mcfadyen M, PA-C   blood glucose meter kit and supplies KIT Dispense based on patient and insurance preference. Use up to four times daily as directed. 1 each Hughie Closs, PA-C      PDMP not reviewed this encounter.   Hughie Closs, [place] 12/08/20 1130

## 2020-12-08 NOTE — ED Triage Notes (Signed)
Pt reports vaginal itching x 2 weeks. Pt tried OTC meds without relief.

## 2020-12-10 LAB — CERVICOVAGINAL ANCILLARY ONLY
Bacterial Vaginitis (gardnerella): NEGATIVE
Candida Glabrata: NEGATIVE
Candida Vaginitis: POSITIVE — AB
Chlamydia: NEGATIVE
Comment: NEGATIVE
Comment: NEGATIVE
Comment: NEGATIVE
Comment: NEGATIVE
Comment: NEGATIVE
Comment: NORMAL
Neisseria Gonorrhea: NEGATIVE
Trichomonas: NEGATIVE

## 2020-12-11 ENCOUNTER — Telehealth (HOSPITAL_COMMUNITY): Payer: Self-pay | Admitting: Emergency Medicine

## 2020-12-11 MED ORDER — FLUCONAZOLE 150 MG PO TABS: 150.0000 mg | ORAL_TABLET | Freq: Once | ORAL | 0 refills | Status: AC

## 2021-02-01 ENCOUNTER — Ambulatory Visit: Payer: Self-pay | Attending: Internal Medicine | Admitting: Internal Medicine

## 2021-02-01 ENCOUNTER — Encounter: Payer: Self-pay | Admitting: Internal Medicine

## 2021-02-01 ENCOUNTER — Other Ambulatory Visit (HOSPITAL_COMMUNITY): Payer: Self-pay

## 2021-02-01 ENCOUNTER — Other Ambulatory Visit: Payer: Self-pay

## 2021-02-01 VITALS — BP 142/85 | HR 88 | Resp 16 | Ht 63.0 in | Wt 197.8 lb

## 2021-02-01 DIAGNOSIS — E1169 Type 2 diabetes mellitus with other specified complication: Secondary | ICD-10-CM | POA: Insufficient documentation

## 2021-02-01 DIAGNOSIS — R030 Elevated blood-pressure reading, without diagnosis of hypertension: Secondary | ICD-10-CM | POA: Insufficient documentation

## 2021-02-01 DIAGNOSIS — Z7689 Persons encountering health services in other specified circumstances: Secondary | ICD-10-CM

## 2021-02-01 DIAGNOSIS — K029 Dental caries, unspecified: Secondary | ICD-10-CM | POA: Insufficient documentation

## 2021-02-01 DIAGNOSIS — Z1159 Encounter for screening for other viral diseases: Secondary | ICD-10-CM

## 2021-02-01 DIAGNOSIS — Z6835 Body mass index (BMI) 35.0-35.9, adult: Secondary | ICD-10-CM

## 2021-02-01 DIAGNOSIS — F17210 Nicotine dependence, cigarettes, uncomplicated: Secondary | ICD-10-CM

## 2021-02-01 DIAGNOSIS — E669 Obesity, unspecified: Secondary | ICD-10-CM

## 2021-02-01 DIAGNOSIS — F17200 Nicotine dependence, unspecified, uncomplicated: Secondary | ICD-10-CM

## 2021-02-01 LAB — GLUCOSE, POCT (MANUAL RESULT ENTRY): POC Glucose: 221 mg/dl — AB (ref 70–99)

## 2021-02-01 LAB — POCT GLYCOSYLATED HEMOGLOBIN (HGB A1C): HbA1c, POC (controlled diabetic range): 9.9 % — AB (ref 0.0–7.0)

## 2021-02-01 MED ORDER — LANTUS SOLOSTAR 100 UNIT/ML ~~LOC~~ SOPN
10.0000 [IU] | PEN_INJECTOR | Freq: Every day | SUBCUTANEOUS | 1 refills | Status: DC
  Filled 2021-02-01: qty 15, 150d supply, fill #0
  Filled 2021-02-01: qty 3, 28d supply, fill #0
  Filled 2021-02-01 (×2): qty 3, 30d supply, fill #0

## 2021-02-01 MED ORDER — NICOTINE 21 MG/24HR TD PT24
21.0000 mg | MEDICATED_PATCH | Freq: Every day | TRANSDERMAL | 1 refills | Status: DC
  Filled 2021-02-01 (×2): qty 28, 28d supply, fill #0

## 2021-02-01 MED ORDER — METFORMIN HCL 500 MG PO TABS
500.0000 mg | ORAL_TABLET | Freq: Two times a day (BID) | ORAL | 5 refills | Status: DC
  Filled 2021-02-01 (×3): qty 60, 30d supply, fill #0
  Filled 2021-03-07: qty 60, 30d supply, fill #1
  Filled 2021-09-19: qty 60, 30d supply, fill #2

## 2021-02-01 MED ORDER — INSULIN PEN NEEDLE 31G X 8 MM MISC
6 refills | Status: DC
  Filled 2021-02-01: qty 100, 30d supply, fill #0
  Filled 2021-02-01: qty 100, 90d supply, fill #0
  Filled 2021-02-01: qty 100, fill #0
  Filled 2021-02-01: qty 100, 30d supply, fill #0

## 2021-02-01 NOTE — Progress Notes (Signed)
CBG-221 A1C-9.9

## 2021-02-01 NOTE — Patient Instructions (Addendum)
Please keep a log of your blood sugar readings and bring them in with you on your next visit.  Blood sugar goals before meals is 90-130.  Please check your blood pressure at least twice a week and record the readings.  Bring them in with you on your next visit.  Try to limit salt in the foods as discussed. Diabetes Mellitus and Standards of Medical Care Living with and managing diabetes (diabetes mellitus) can be complicated. Your diabetes treatment may be managed by a team of health care providers, including: A physician who specializes in diabetes (endocrinologist). You might also have visits with a nurse practitioner or physician assistant. Nurses. A registered dietitian. A certified diabetes care and education specialist. An exercise specialist. A pharmacist. An eye doctor. A foot specialist (podiatrist). A dental care provider. A primary care provider. A mental health care provider. How to manage your diabetes You can do many things to successfully manage your diabetes. Your health care providers will follow guidelines to help you get the best quality of care. Here are general guidelines for your diabetes management plan. Your health care providers may give you more specific instructions. Physical exams When you are diagnosed with diabetes, and each year after that, your health care provider will ask about your medical and family history. You will have a physical exam, which may include: Measuring your height, weight, and body mass index (BMI). Checking your blood pressure. This will be done at every routine medical visit. Your target blood pressure may vary depending on your medical conditions, your age, and other factors. A thyroid exam. A skin exam. Screening for nerve damage (peripheral neuropathy). This may include checking the pulse in your legs and feet and the level of sensation in your hands and feet. A foot exam to inspect the structure and skin of your feet, including  checking for cuts, bruises, redness, blisters, sores, or other problems. Screening for blood vessel (vascular) problems. This may include checking the pulse in your legs and feet and checking your temperature. Blood tests Depending on your treatment plan and your personal needs, you may have the following tests: Hemoglobin A1C (HbA1C). This test provides information about blood sugar (glucose) control over the previous 2-3 months. It is used to adjust your treatment plan, if needed. This test will be done: At least 2 times a year, if you are meeting your treatment goals. 4 times a year, if you are not meeting your treatment goals or if your goals have changed. Lipid testing, including total cholesterol, LDL and HDL cholesterol, and triglyceride levels. The goal for LDL is less than 100 mg/dL (5.5 mmol/L). If you are at high risk for complications, the goal is less than 70 mg/dL (3.9 mmol/L). The goal for HDL is 40 mg/dL (2.2 mmol/L) or higher for men, and 50 mg/dL (2.8 mmol/L) or higher for women. An HDL cholesterol of 60 mg/dL (3.3 mmol/L) or higher gives some protection against heart disease. The goal for triglycerides is less than 150 mg/dL (8.3 mmol/L). Liver function tests. Kidney function tests. Thyroid function tests.  Dental and eye exams  Visit your dentist two times a year. If you have type 1 diabetes, your health care provider may recommend an eye exam within 5 years after you are diagnosed, and then once a year after your first exam. For children with type 1 diabetes, the health care provider may recommend an eye exam when your child is age 67 or older and has had diabetes for 3-5 years.  After the first exam, your child should get an eye exam once a year. If you have type 2 diabetes, your health care provider may recommend an eye exam as soon as you are diagnosed, and then every 1-2 years after your first exam. Immunizations A yearly flu (influenza) vaccine is recommended annually  for everyone 6 months or older. This is especially important if you have diabetes. The pneumonia (pneumococcal) vaccine is recommended for everyone 2 years or older who has diabetes. If you are age 35 or older, you may get the pneumonia vaccine as a series of two separate shots. The hepatitis B vaccine is recommended for adults shortly after being diagnosed with diabetes. Adults and children with diabetes should receive all other vaccines according to age-specific recommendations from the Centers for Disease Control and Prevention (CDC). Mental and emotional health Screening for symptoms of eating disorders, anxiety, and depression is recommended at the time of diagnosis and after as needed. If your screening shows that you have symptoms, you may need more evaluation. You may work with a mental health care provider. Follow these instructions at home: Treatment plan You will monitor your blood glucose levels and may give yourself insulin. Your treatment plan will be reviewed at every medical visit. You and your health care provider will discuss: How you are taking your medicines, including insulin. Any side effects you have. Your blood glucose level target goals. How often you monitor your blood glucose level. Lifestyle habits, such as activity level and tobacco, alcohol, and substance use. Education Your health care provider will assess how well you are monitoring your blood glucose levels and whether you are taking your insulin and medicines correctly. He or she may refer you to: A certified diabetes care and education specialist to manage your diabetes throughout your life, starting at diagnosis. A registered dietitian who can create and review your personal nutrition plan. An exercise specialist who can discuss your activity level and exercise plan. General instructions Take over-the-counter and prescription medicines only as told by your health care provider. Keep all follow-up visits. This  is important. Where to find support There are many diabetes support networks, including: American Diabetes Association (ADA): diabetes.org Defeat Diabetes Foundation: defeatdiabetes.org Where to find more information American Diabetes Association (ADA): www.diabetes.org Association of Diabetes Care & Education Specialists (ADCES): diabeteseducator.org International Diabetes Federation (IDF): http://hill.biz/ Summary Managing diabetes (diabetes mellitus) can be complicated. Your diabetes treatment may be managed by a team of health care providers. Your health care providers follow guidelines to help you get the best quality care. You should have physical exams, blood tests, blood pressure monitoring, immunizations, and screening tests regularly. Stay updated on how to manage your diabetes. Your health care providers may also give you more specific instructions based on your individual health. This information is not intended to replace advice given to you by your health care provider. Make sure you discuss any questions you have with your health care provider. Document Revised: 07/21/2019 Document Reviewed: 07/21/2019 Elsevier Patient Education  2022 [employer].

## 2021-02-01 NOTE — Progress Notes (Signed)
Patient ID: Sherri Hall, female    DOB: July 12, 1975  MRN: [id number]  CC: New Patient (Initial Visit)   Subjective: Sherri Hall is a 46 y.o. female who presents for new patient visit. Her concerns today include:  Patient with history of tob dep  No PCP in a while.  Went to [facility] 11/2020 for vaginal infection and BS was checked, found to be 343.  Started on Metformin 500 mg BID but out x 1.5 wks. Checks BS 3x day.  In a.m BS 160s-265. Yesterday a.m BS 238.  No family hx of DM Endorses polyuria/polydipsia. No wgh loss, blurred vision, numbness in extremities. Loves sodas.   She has not been exercising.  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  Has insurance but has not received card as yet.  Tob dep: smokes 8 cig/day.  Smoked for 15 yrs.  Tried to quit several times, longest was for 2 wks.  Wants to quit.    BP elev today.  Fhx of BP in father.  Denies any headaches or dizziness at this time.  No lower extremity edema.  HM:  declines flu shot, PCV and tdapt until insurance kicks in.  Over due for PAP and colon screening  There are no problems to display for this patient.    Current Outpatient Medications on File Prior to Visit  Medication Sig Dispense Refill   blood glucose meter kit and supplies KIT Dispense based on patient and insurance preference. Use up to four times daily as directed. (Patient not taking: Reported on 02/01/2021) 1 each 0   [DISCONTINUED] famotidine (PEPCID) 20 MG tablet Take 1 tablet (20 mg total) by mouth 2 (two) times daily. 30 tablet 0   No current facility-administered medications on file prior to visit.    No Known Allergies  Social History   Socioeconomic History   Marital status: Single    Spouse name: Not on file   Number of children: 5   Years of education: Not on file   Highest education level: GED or equivalent  Occupational History   Occupation: CNA  Tobacco Use   Smoking status: Every Day    Packs/day: 0.50    Types: Cigarettes    Smokeless tobacco: Never  Vaping Use   Vaping Use: Never used  Substance and Sexual Activity   Alcohol use: No   Drug use: No   Sexual activity: Not Currently  Other Topics Concern   Not on file  Social History Narrative   Not on file   Social Determinants of Health   Financial Resource Strain: Not on file  Food Insecurity: Not on file  Transportation Needs: Not on file  Physical Activity: Not on file  Stress: Not on file  Social Connections: Not on file  Intimate Partner Violence: Not on file    Family History  Problem Relation Age of Onset   Congestive Heart Failure Mother    Hypertension Mother    Healthy Mother    Hypertension Father    Seizures Father    Congestive Heart Failure Father     Past Surgical History:  Procedure Laterality Date   CHOLECYSTECTOMY      ROS: Review of Systems Negative except as stated above  PHYSICAL EXAM: BP (!) 142/85    Pulse 88    Resp 16    Ht '5\' 3"'  (1.6 m)    Wt 197 lb 12.8 oz (89.7 kg)    SpO2 98%    BMI 35.04  kg/m   Physical Exam  General appearance - alert, well appearing, obese middle-age African-American female and in no distress Mental status - normal mood, behavior, speech, dress, motor activity, and thought processes Nose - normal and patent, no erythema, discharge or polyps Mouth - mucous membranes moist, pharynx normal without lesions.  She has numerous rotted teeth that are broken off in the gum Neck - supple, no significant adenopathy Chest - clear to auscultation, no wheezes, rales or rhonchi, symmetric air entry Heart - normal rate, regular rhythm, normal S1, S2, no murmurs, rubs, clicks or gallops Extremities - peripheral pulses normal, no pedal edema, no clubbing or cyanosis Diabetic Foot Exam - Simple   Simple Foot Form Diabetic Foot exam was performed with the following findings: Yes 02/01/2021 12:45 PM  Visual Inspection No deformities, no ulcerations, no other skin breakdown bilaterally: Yes Sensation  Testing Intact to touch and monofilament testing bilaterally: Yes Pulse Check Posterior Tibialis and Dorsalis pulse intact bilaterally: Yes Comments    Results for orders placed or performed in visit on 02/01/21  POCT glucose (manual entry)  Result Value Ref Range   POC Glucose 221 (A) 70 - 99 mg/dl  POCT glycosylated hemoglobin (Hb A1C)  Result Value Ref Range   Hemoglobin A1C     HbA1c POC (<> result, manual entry)     HbA1c, POC (prediabetic range)     HbA1c, POC (controlled diabetic range) 9.9 (A) 0.0 - 7.0 %     CMP Latest Ref Rng & Units 02/18/2018 10/22/2015 01/01/2008  Glucose 70 - 99 mg/dL 115(H) 116(H) 98  BUN 6 - 20 mg/dL '8 8 12  ' Creatinine 0.44 - 1.00 mg/dL 0.93 0.77 0.80  Sodium 135 - 145 mmol/L 138 138 133(L)  Potassium 3.5 - 5.1 mmol/L 4.0 4.0 4.3  Chloride 98 - 111 mmol/L 107 108 103  CO2 22 - 32 mmol/L '23 23 22  ' Calcium 8.9 - 10.3 mg/dL 9.5 9.0 9.1  Total Protein 6.5 - 8.1 g/dL - 6.5 7.6  Total Bilirubin 0.3 - 1.2 mg/dL - 0.6 0.9  Alkaline Phos 38 - 126 U/L - 62 71  AST 15 - 41 U/L - 19 17  ALT 14 - 54 U/L - 12(L) 15   Lipid Panel  No results found for: CHOL, TRIG, HDL, CHOLHDL, VLDL, LDLCALC, LDLDIRECT  CBC    Component Value Date/Time   WBC 7.7 02/18/2018 1353   RBC 4.79 02/18/2018 1353   HGB 13.2 02/18/2018 1353   HCT 41.5 02/18/2018 1353   PLT 389 02/18/2018 1353   MCV 86.6 02/18/2018 1353   MCH 27.6 02/18/2018 1353   MCHC 31.8 02/18/2018 1353   RDW 13.4 02/18/2018 1353   LYMPHSABS 1.1 11/01/2009 1848   MONOABS 1.1 (H) 11/01/2009 1848   EOSABS 0.4 11/01/2009 1848   BASOSABS 0.0 11/01/2009 1848    ASSESSMENT AND PLAN: 1. Establishing care with new doctor, encounter for   2. Diabetes mellitus type 2 in obese ([place]) I spent quite a bit of time doing diabetic teaching with her going over what his diabetes, how it is managed and the importance of healthy eating habits and regular exercise.  Discussed complications of diabetes and how to prevent  complications. -Went over blood sugar goals being 90-130 before meals. Add Lantus insulin 10 units at bedtime.  Continue metformin 500 mg twice a day.  Went over signs and symptoms of hypoglycemia and how to treat. -Advised of the importance of yearly eye exams.  We will refer  once she has her insurance card. Patient advised to eliminate sugary drinks from the diet, cut back on portion sizes especially of white carbohydrates, eat more white lean meat like chicken [place] and seafood instead of beef or pork and incorporate fresh fruits and vegetables into the diet daily.  - POCT glucose (manual entry) - POCT glycosylated hemoglobin (Hb A1C) - Microalbumin / creatinine urine ratio - CBC - Comprehensive metabolic panel - Lipid panel - Amb ref to Medical Nutrition Therapy-MNT - metFORMIN (GLUCOPHAGE) 500 MG tablet; Take 1 tablet (500 mg total) by mouth 2 (two) times daily with a meal.  Dispense: 60 tablet; Refill: 5 - insulin glargine (LANTUS SOLOSTAR) 100 UNIT/ML Solostar Pen; Inject 10 Units into the skin at bedtime.  Dispense: 15 mL; Refill: 1 - Insulin Pen Needle 31G X 8 MM MISC; Use as directed  Dispense: 100 each; Refill: 6  3. Elevated blood-pressure reading without diagnosis of hypertension DASH diet discussed and encouraged. We will recheck blood pressure on next visit.  If still elevated we will start her on an ACE or an ARB.  4. Tobacco dependence Pt is current smoker. Patient advised to quit smoking. Discussed health risks associated with smoking including lung and other types of cancers, chronic lung diseases and CV risks.. Pt ready/not ready to give trail of quitting.   Discussed methods to help quit including quitting cold [place], use of NRT, Chantix and Bupropion.  Pt wanting to try: Nicotine patches.  We will start her on the 21 mg patch and do a stepdown approach over time. _3 minutes_ Minutes spent on counseling. F/U: Assess progress on next visit  - nicotine (NICODERM CQ  - DOSED IN MG/24 HOURS) 21 mg/24hr patch; Place 1 patch (21 mg total) onto the skin daily.  Dispense: 28 patch; Refill: 1  5. Dental cavities Advised to see a dentist once she has her insurance card.  6. Need for hepatitis C screening test - HCV Ab w Reflex to Quant PCR    Patient was given the opportunity to ask questions.  Patient verbalized understanding of the plan and was able to repeat key elements of the plan.   Orders Placed This Encounter  Procedures   Microalbumin / creatinine urine ratio   CBC   Comprehensive metabolic panel   HCV Ab w Reflex to Quant PCR   Lipid panel   Amb ref to Medical Nutrition Therapy-MNT   POCT glucose (manual entry)   POCT glycosylated hemoglobin (Hb A1C)     Requested Prescriptions   Signed Prescriptions Disp Refills   nicotine (NICODERM CQ - DOSED IN MG/24 HOURS) 21 mg/24hr patch 28 patch 1    Sig: Place 1 patch (21 mg total) onto the skin daily.   metFORMIN (GLUCOPHAGE) 500 MG tablet 60 tablet 5    Sig: Take 1 tablet (500 mg total) by mouth 2 (two) times daily with a meal.   insulin glargine (LANTUS SOLOSTAR) 100 UNIT/ML Solostar Pen 15 mL 1    Sig: Inject 10 Units into the skin at bedtime.   Insulin Pen Needle 31G X 8 MM MISC 100 each 6    Sig: Use as directed    Return in about 1 month (around 03/04/2021) for and f/u BP/Diabetes.  Karle Plumber, MD, FACP

## 2021-02-02 ENCOUNTER — Other Ambulatory Visit: Payer: Self-pay | Admitting: Internal Medicine

## 2021-02-02 LAB — COMPREHENSIVE METABOLIC PANEL
ALT: 13 IU/L (ref 0–32)
AST: 16 IU/L (ref 0–40)
Albumin/Globulin Ratio: 1.5 (ref 1.2–2.2)
Albumin: 4.3 g/dL (ref 3.8–4.8)
Alkaline Phosphatase: 89 IU/L (ref 44–121)
BUN/Creatinine Ratio: 13 (ref 9–23)
BUN: 10 mg/dL (ref 6–24)
Bilirubin Total: 0.3 mg/dL (ref 0.0–1.2)
CO2: 23 mmol/L (ref 20–29)
Calcium: 9.7 mg/dL (ref 8.7–10.2)
Chloride: 102 mmol/L (ref 96–106)
Creatinine, Ser: 0.75 mg/dL (ref 0.57–1.00)
Globulin, Total: 2.9 g/dL (ref 1.5–4.5)
Glucose: 188 mg/dL — ABNORMAL HIGH (ref 70–99)
Potassium: 4.5 mmol/L (ref 3.5–5.2)
Sodium: 139 mmol/L (ref 134–144)
Total Protein: 7.2 g/dL (ref 6.0–8.5)
eGFR: 100 mL/min/{1.73_m2} (ref >59)

## 2021-02-02 LAB — LIPID PANEL
Chol/HDL Ratio: 4 ratio (ref 0.0–4.4)
Cholesterol, Total: 174 mg/dL (ref 100–199)
HDL: 43 mg/dL (ref >39)
LDL Chol Calc (NIH): 114 mg/dL — ABNORMAL HIGH (ref 0–99)
Triglycerides: 89 mg/dL (ref 0–149)
VLDL Cholesterol Cal: 17 mg/dL (ref 5–40)

## 2021-02-02 LAB — CBC
Hematocrit: 41.8 % (ref 34.0–46.6)
Hemoglobin: 13.9 g/dL (ref 11.1–15.9)
MCH: 28.3 pg (ref 26.6–33.0)
MCHC: 33.3 g/dL (ref 31.5–35.7)
MCV: 85 fL (ref 79–97)
Platelets: 443 10*3/uL (ref 150–450)
RBC: 4.92 x10E6/uL (ref 3.77–5.28)
RDW: 13.3 % (ref 11.7–15.4)
WBC: 6.4 10*3/uL (ref 3.4–10.8)

## 2021-02-02 LAB — MICROALBUMIN / CREATININE URINE RATIO
Creatinine, Urine: 76.2 mg/dL
Microalb/Creat Ratio: 5 mg/g creat (ref 0–29)
Microalbumin, Urine: 3.9 ug/mL

## 2021-02-02 LAB — HCV INTERPRETATION

## 2021-02-02 LAB — HCV AB W REFLEX TO QUANT PCR: HCV Ab: 0.2 s/co ratio (ref 0.0–0.9)

## 2021-02-02 MED ORDER — ATORVASTATIN CALCIUM 20 MG PO TABS
20.0000 mg | ORAL_TABLET | Freq: Every day | ORAL | 3 refills | Status: DC
  Filled 2021-02-02 – 2021-02-07 (×2): qty 30, 30d supply, fill #0
  Filled 2021-03-07: qty 30, 30d supply, fill #1
  Filled 2021-04-12: qty 30, 30d supply, fill #2

## 2021-02-04 ENCOUNTER — Other Ambulatory Visit (HOSPITAL_COMMUNITY): Payer: Self-pay

## 2021-02-07 ENCOUNTER — Other Ambulatory Visit (HOSPITAL_COMMUNITY): Payer: Self-pay

## 2021-03-07 ENCOUNTER — Other Ambulatory Visit (HOSPITAL_COMMUNITY): Payer: Self-pay

## 2021-03-12 ENCOUNTER — Other Ambulatory Visit (HOSPITAL_COMMUNITY): Payer: Self-pay

## 2021-03-21 ENCOUNTER — Ambulatory Visit: Payer: Self-pay | Admitting: Dietician

## 2021-03-25 ENCOUNTER — Telehealth: Payer: Self-pay

## 2021-03-25 ENCOUNTER — Other Ambulatory Visit: Payer: Self-pay

## 2021-03-25 ENCOUNTER — Ambulatory Visit: Payer: BC Managed Care – PPO | Attending: Internal Medicine | Admitting: Internal Medicine

## 2021-03-25 DIAGNOSIS — Z1231 Encounter for screening mammogram for malignant neoplasm of breast: Secondary | ICD-10-CM

## 2021-03-25 DIAGNOSIS — E785 Hyperlipidemia, unspecified: Secondary | ICD-10-CM

## 2021-03-25 DIAGNOSIS — E1169 Type 2 diabetes mellitus with other specified complication: Secondary | ICD-10-CM | POA: Diagnosis not present

## 2021-03-25 DIAGNOSIS — Z1211 Encounter for screening for malignant neoplasm of colon: Secondary | ICD-10-CM | POA: Diagnosis not present

## 2021-03-25 DIAGNOSIS — R030 Elevated blood-pressure reading, without diagnosis of hypertension: Secondary | ICD-10-CM

## 2021-03-25 DIAGNOSIS — E669 Obesity, unspecified: Secondary | ICD-10-CM

## 2021-03-25 MED ORDER — TRULICITY 0.75 MG/0.5ML ~~LOC~~ SOAJ: 0.7500 mg | SUBCUTANEOUS | 4 refills | Status: DC

## 2021-03-25 NOTE — Progress Notes (Signed)
Patient ID: Sherri Hall, female   DOB: 17-Feb-1975, 46 y.o.   MRN: [id number] Virtual Visit via Telephone Note  I connected with Sherri Hall on 03/25/2021 at 11:33 a.m by telephone and verified that I am speaking with the correct person using two identifiers  Location: Patient: home Provider: home  Participants: Myself Patient   I discussed the limitations, risks, security and privacy concerns of performing an evaluation and management service by telephone and the availability of in person appointments. I also discussed with the patient that there may be a patient responsible charge related to this service. The patient expressed understanding and agreed to proceed.   History of Present Illness: Patient with history of DM type II, tobacco dependence, HL, elevated BP.  This is 6 wks f/u DM.  DM: -Lantus 10 units daily added to Metformin 500 mg BID on last visit. Checking BS before BF and at nights.  A.m range 120-130s.  It was in the 200s before started insulin.  Bedtime range 130-140. -She stopped drinking sodas and stopped snacking as much -started walking in the afternoons on her break and days off -she now has insurance called [employer] (ID# [id number].  GP#: [id number]) Wants to know if she can now be changed to Trulicity or Mounjaro Had eye exam at [facility] 1 mth ago.  Got new glasses.  Has DM eye exam coming up  BP was elevated on last visit. She has been checking BP at work every morning.  Reports range has been 120s/80  LDL on blood test was 114.  I recommended starting atorvastatin.  She did get the prescription filled and is taking and tolerating it.  HM:  due for MMG, colon CA screening, PAP Outpatient Encounter Medications as of 03/25/2021  Medication Sig   atorvastatin (LIPITOR) 20 MG tablet Take 1 tablet (20 mg total) by mouth daily.   blood glucose meter kit and supplies KIT Dispense based on patient and insurance preference. Use up to four times daily as  directed. (Patient not taking: Reported on 02/01/2021)   insulin glargine (LANTUS SOLOSTAR) 100 UNIT/ML Solostar Pen Inject 10 Units into the skin at bedtime.   Insulin Pen Needle 31G X 8 MM MISC Use as directed   metFORMIN (GLUCOPHAGE) 500 MG tablet Take 1 tablet (500 mg total) by mouth 2 (two) times daily with a meal.   nicotine (NICODERM CQ - DOSED IN MG/24 HOURS) 21 mg/24hr patch Place 1 patch (21 mg total) onto the skin daily.   [DISCONTINUED] famotidine (PEPCID) 20 MG tablet Take 1 tablet (20 mg total) by mouth 2 (two) times daily.   No facility-administered encounter medications on file as of 03/25/2021.      Observations/Objective:  Results for orders placed or performed in visit on 02/01/21  Microalbumin / creatinine urine ratio  Result Value Ref Range   Creatinine, Urine 76.2 Not Estab. mg/dL   Microalbumin, Urine 3.9 Not Estab. ug/mL   Microalb/Creat Ratio 5 0 - 29 mg/g creat  CBC  Result Value Ref Range   WBC 6.4 3.4 - 10.8 x10E3/uL   RBC 4.92 3.77 - 5.28 x10E6/uL   Hemoglobin 13.9 11.1 - 15.9 g/dL   Hematocrit 41.8 34.0 - 46.6 %   MCV 85 79 - 97 fL   MCH 28.3 26.6 - 33.0 pg   MCHC 33.3 31.5 - 35.7 g/dL   RDW 13.3 11.7 - 15.4 %   Platelets 443 150 - 450 x10E3/uL  Comprehensive metabolic panel  Result Value Ref Range  Glucose 188 (H) 70 - 99 mg/dL   BUN 10 6 - 24 mg/dL   Creatinine, Ser 0.75 0.57 - 1.00 mg/dL   eGFR 100 >59 mL/min/1.73   BUN/Creatinine Ratio 13 9 - 23   Sodium 139 134 - 144 mmol/L   Potassium 4.5 3.5 - 5.2 mmol/L   Chloride 102 96 - 106 mmol/L   CO2 23 20 - 29 mmol/L   Calcium 9.7 8.7 - 10.2 mg/dL   Total Protein 7.2 6.0 - 8.5 g/dL   Albumin 4.3 3.8 - 4.8 g/dL   Globulin, Total 2.9 1.5 - 4.5 g/dL   Albumin/Globulin Ratio 1.5 1.2 - 2.2   Bilirubin Total 0.3 0.0 - 1.2 mg/dL   Alkaline Phosphatase 89 44 - 121 IU/L   AST 16 0 - 40 IU/L   ALT 13 0 - 32 IU/L  HCV Ab w Reflex to Quant PCR  Result Value Ref Range   HCV Ab 0.2 0.0 - 0.9 s/co ratio   Lipid panel  Result Value Ref Range   Cholesterol, Total 174 100 - 199 mg/dL   Triglycerides 89 0 - 149 mg/dL   HDL 43 >39 mg/dL   VLDL Cholesterol Cal 17 5 - 40 mg/dL   LDL Chol Calc (NIH) 114 (H) 0 - 99 mg/dL   Chol/HDL Ratio 4.0 0.0 - 4.4 ratio  Interpretation:  Result Value Ref Range   HCV Interp 1: Comment   POCT glucose (manual entry)  Result Value Ref Range   POC Glucose 221 (A) 70 - 99 mg/dl  POCT glycosylated hemoglobin (Hb A1C)  Result Value Ref Range   Hemoglobin A1C     HbA1c POC (<> result, manual entry)     HbA1c, POC (prediabetic range)     HbA1c, POC (controlled diabetic range) 9.9 (A) 0.0 - 7.0 %    Assessment and Plan: 1. Diabetes mellitus type 2 in obese (HCC) Blood sugars have significantly improved.  Patient interested in trying a GLP1 agent not just for the diabetes but also to help with weight management. Now that she has insurance, we will put her on Trulicity.  When she starts the Trulicity, patient advised she can stop with the Lantus but continue metformin.  She will let me know if her insurance approves that so that we can schedule her with the clinical pharmacist for teaching on administration.  Went over with her how Trulicity works.  Also discussed possible side effects including nausea, vomiting and abdominal pain.  Advised to stop the medication if she develops vomiting or pain in the upper abdomen as this can be indicative of pancreatitis - Dulaglutide (TRULICITY) 1.32 GM/0.1UU SOPN; Inject 0.75 mg into the skin once a week.  Dispense: 2 mL; Refill: 4  2. Elevated blood-pressure reading without diagnosis of hypertension Blood pressure is better.  Advised to continue DASH diet and regular exercise.  3. Hyperlipidemia associated with type 2 diabetes mellitus (HCC) Continue atorvastatin.  4. Encounter for screening mammogram for malignant neoplasm of breast - MM Digital Screening; Future  5. Screening for colon cancer - Ambulatory referral to  Gastroenterology   Follow Up Instructions: We will have her follow-up with me in about 1 month for Pap smear.   I discussed the assessment and treatment plan with the patient. The patient was provided an opportunity to ask questions and all were answered. The patient agreed with the plan and demonstrated an understanding of the instructions.   The patient was advised to call back or seek  an in-person evaluation if the symptoms worsen or if the condition fails to improve as anticipated.  I  Spent 20 minutes on this telephone encounter  This note has been created with Surveyor, quantity. Any transcriptional errors are unintentional.  Karle Plumber, MD

## 2021-03-25 NOTE — Telephone Encounter (Signed)
PA for Trulicity is approved from 02/23/21 until 03/25/2022

## 2021-04-12 ENCOUNTER — Other Ambulatory Visit (HOSPITAL_COMMUNITY): Payer: Self-pay

## 2021-04-29 ENCOUNTER — Encounter: Payer: Self-pay | Admitting: Internal Medicine

## 2021-04-29 ENCOUNTER — Other Ambulatory Visit (HOSPITAL_COMMUNITY)
Admission: RE | Admit: 2021-04-29 | Discharge: 2021-04-29 | Disposition: A | Discharge status: Home / Self Care | Payer: BC Managed Care – PPO | Source: Ambulatory Visit | Attending: Internal Medicine | Admitting: Internal Medicine

## 2021-04-29 ENCOUNTER — Ambulatory Visit: Payer: BC Managed Care – PPO | Attending: Internal Medicine | Admitting: Internal Medicine

## 2021-04-29 VITALS — BP 110/71 | HR 96 | Resp 16 | Wt 195.4 lb

## 2021-04-29 DIAGNOSIS — Z124 Encounter for screening for malignant neoplasm of cervix: Secondary | ICD-10-CM | POA: Diagnosis present

## 2021-04-29 DIAGNOSIS — E669 Obesity, unspecified: Secondary | ICD-10-CM | POA: Diagnosis not present

## 2021-04-29 DIAGNOSIS — E1169 Type 2 diabetes mellitus with other specified complication: Secondary | ICD-10-CM

## 2021-04-29 MED ORDER — TIRZEPATIDE 2.5 MG/0.5ML ~~LOC~~ SOAJ: 2.5000 mg | SUBCUTANEOUS | 0 refills | Status: DC

## 2021-04-29 MED ORDER — TIRZEPATIDE 5 MG/0.5ML ~~LOC~~ SOAJ
5.0000 mg | SUBCUTANEOUS | 2 refills | Status: DC
Start: 2021-04-29 — End: 2022-04-10

## 2021-04-29 NOTE — Progress Notes (Signed)
? ? ?Patient ID: Sherri Hall, female    DOB: 10/17/1975  MRN: [id number] ? ?CC: Gynecologic Exam ? ? ?Subjective: ?Sherri Hall is a 46 y.o. female who presents for pap ?Her concerns today include:  ?Patient with history of DM type II, tobacco dependence, HL, elevated BP ? ?DM:  Started on Trulicity on last visit to help with DM control and help with wgh loss.  Insurance does not cover Trulicity but does cover [employer].  Still on Lantus 10 units and Metformin ?Checking BS BF breakfast and before dinner.  BS b/w 112-125 before BF and before dinner range 150-170 ?Trying her best not to over eat.  Has cut back on sodas.  Drinks a small can once a day ? ?GYN History:  ?Pt is G7P5 (2 abortions) ?Any hx of abn paps?: no ?Menses regular or irregular?:  regular ?How long does menses last? 5 days ?Menstrual flow light or heavy?:  heavy first 2 days ?Method of birth control?:   condoms consistently ?Any vaginal dischg at this time?:  no ?Dysuria?: no ?Any hx of STI?:  no ?Sexually active with how many partners: one female partner ?Desires STI screen: yes ?Last MMG: MMG scheduled for tomorrow ?Family hx of uterine, cervical or breast cancer?:  no  ? ?Patient Active Problem List  ? Diagnosis Date Noted  ? Hyperlipidemia associated with type 2 diabetes mellitus ([place]) 03/25/2021  ? Diabetes mellitus type 2 in obese ([place]) 02/01/2021  ? Elevated blood-pressure reading without diagnosis of hypertension 02/01/2021  ? Tobacco dependence 02/01/2021  ? Dental cavities 02/01/2021  ?  ? ?Current Outpatient Medications on File Prior to Visit  ?Medication Sig Dispense Refill  ? atorvastatin (LIPITOR) 20 MG tablet Take 1 tablet (20 mg total) by mouth daily. 30 tablet 3  ? blood glucose meter kit and supplies KIT Dispense based on patient and insurance preference. Use up to four times daily as directed. (Patient not taking: Reported on 02/01/2021) 1 each 0  ? insulin glargine (LANTUS SOLOSTAR) 100 UNIT/ML Solostar Pen Inject 10 Units  into the skin at bedtime. 15 mL 1  ? Insulin Pen Needle 31G X 8 MM MISC Use as directed 100 each 6  ? metFORMIN (GLUCOPHAGE) 500 MG tablet Take 1 tablet (500 mg total) by mouth 2 (two) times daily with a meal. 60 tablet 5  ? nicotine (NICODERM CQ - DOSED IN MG/24 HOURS) 21 mg/24hr patch Place 1 patch (21 mg total) onto the skin daily. 28 patch 1  ? [DISCONTINUED] famotidine (PEPCID) 20 MG tablet Take 1 tablet (20 mg total) by mouth 2 (two) times daily. 30 tablet 0  ? ?No current facility-administered medications on file prior to visit.  ? ? ?No Known Allergies ? ?Social History  ? ?Socioeconomic History  ? Marital status: Single  ?  Spouse name: Not on file  ? Number of children: 5  ? Years of education: Not on file  ? Highest education level: GED or equivalent  ?Occupational History  ? Occupation: CNA  ?Tobacco Use  ? Smoking status: Every Day  ?  Packs/day: 0.50  ?  Types: Cigarettes  ? Smokeless tobacco: Never  ?Vaping Use  ? Vaping Use: Never used  ?Substance and Sexual Activity  ? Alcohol use: No  ? Drug use: No  ? Sexual activity: Not Currently  ?Other Topics Concern  ? Not on file  ?Social History Narrative  ? Not on file  ? ?Social Determinants of Health  ? ?Financial Resource Strain:  Not on file  ?Food Insecurity: Not on file  ?Transportation Needs: Not on file  ?Physical Activity: Not on file  ?Stress: Not on file  ?Social Connections: Not on file  ?Intimate Partner Violence: Not on file  ? ? ?Family History  ?Problem Relation Age of Onset  ? Congestive Heart Failure Mother   ? Hypertension Mother   ? Healthy Mother   ? Hypertension Father   ? Seizures Father   ? Congestive Heart Failure Father   ? ? ?Past Surgical History:  ?Procedure Laterality Date  ? CHOLECYSTECTOMY    ? ? ?ROS: ?Review of Systems ?Negative except as stated above ? ?PHYSICAL EXAM: ?BP 110/71   Pulse 96   Resp 16   Wt 195 lb 6.4 oz (88.6 kg)   SpO2 98%   BMI 34.61 kg/m?   ?Wt Readings from Last 3 Encounters:  ?04/29/21 195 lb 6.4  oz (88.6 kg)  ?02/01/21 197 lb 12.8 oz (89.7 kg)  ?01/25/19 207 lb (93.9 kg)  ? ? ?Physical Exam ? ?General appearance - alert, well appearing, and in no distress ?Mental status - normal mood, behavior, speech, dress, motor activity, and thought processes ?Pelvic - Doloris Hall, CMA present:  normal external genitalia, vulva, vagina, cervix, uterus and adnexa.  Mild amount of white dischg in vaginal vault and around cervix ? ? ? ?  Latest Ref Rng & Units 02/01/2021  ? 10:04 AM 02/18/2018  ?  1:53 PM 10/22/2015  ?  5:06 PM  ?CMP  ?Glucose 70 - 99 mg/dL 188   115   116    ?BUN 6 - 24 mg/dL _0 ?Creatinine 0.57 - 1.00 mg/dL 0.75   0.93   0.77    ?Sodium 134 - 144 mmol/L 139   138   138    ?Potassium 3.5 - 5.2 mmol/L 4.5   4.0   4.0    ?Chloride 96 - 106 mmol/L 102   107   108    ?CO2 20 - 29 mmol/L _1 ?Calcium 8.7 - 10.2 mg/dL 9.7   9.5   9.0    ?Total Protein 6.0 - 8.5 g/dL 7.2    6.5    ?Total Bilirubin 0.0 - 1.2 mg/dL 0.3    0.6    ?Alkaline Phos 44 - 121 IU/L 89    62    ?AST 0 - 40 IU/L 16    19    ?ALT 0 - 32 IU/L 13    12    ? ?Lipid Panel  ?   ?Component Value Date/Time  ? CHOL 174 02/01/2021 1004  ? TRIG 89 02/01/2021 1004  ? HDL 43 02/01/2021 1004  ? CHOLHDL 4.0 02/01/2021 1004  ? LDLCALC 114 (H) 02/01/2021 1004  ? ? ?CBC ?   ?Component Value Date/Time  ? WBC 6.4 02/01/2021 1004  ? WBC 7.7 02/18/2018 1353  ? RBC 4.92 02/01/2021 1004  ? RBC 4.79 02/18/2018 1353  ? HGB 13.9 02/01/2021 1004  ? HCT 41.8 02/01/2021 1004  ? PLT 443 02/01/2021 1004  ? MCV 85 02/01/2021 1004  ? MCH 28.3 02/01/2021 1004  ? MCH 27.6 02/18/2018 1353  ? MCHC 33.3 02/01/2021 1004  ? MCHC 31.8 02/18/2018 1353  ? RDW 13.3 02/01/2021 1004  ? LYMPHSABS 1.1 11/01/2009 1848  ? MONOABS 1.1 (H) 11/01/2009 1848  ? EOSABS 0.4 11/01/2009 1848  ? BASOSABS 0.0  11/01/2009 1848  ? ? ?ASSESSMENT AND PLAN: ? ?1. Pap smear for cervical cancer screening ?- Cervicovaginal ancillary only ?- Cytology - PAP ? ?2. Diabetes mellitus type 2  in obese [facility]) ?We will go ahead and get her on Mounjaro.  I went over with her how the medication works and possible side effects.  No prior history of pancreatitis or thyroid disease. ?Advised that if she develops any vomiting or pain in the upper abdomen once she has stopped the medicine, she should stop the [facility] and give me a call.  Advised that the medication can cause some nausea especially the first several weeks.   ?She is not on any birth control pills. ?We will have her start 1 to 2.5 mg once a week for 4 weeks.  Once she starts the 2.5 mg, she should decrease the Lantus insulin to 5 units daily.  After 1 month, the Mounjaro will be increased to 5 mg and at that time she can stop the Lantus insulin.  Advised of the importance of continuing to monitor her blood sugars at least twice a day before meals.   Clinical pharmacist met with patient today for teaching on administration. ?- tirzepatide [facility]) 2.5 MG/0.5ML Pen; Inject 2.5 mg into the skin once a week.  Dispense: 2 mL; Refill: 0 ?- tirzepatide (MOUNJARO) 5 MG/0.5ML Pen; Inject 5 mg into the skin once a week. Start 1 mth after the 2.5 mg  Dispense: 2 mL; Refill: 2 ? ? ?Patient was given the opportunity to ask questions.  Patient verbalized understanding of the plan and was able to repeat key elements of the plan.  ? ?This documentation was completed using Radio producer.  Any transcriptional errors are unintentional. ? ?No orders of the defined types were placed in this encounter. ? ? ? ?Requested Prescriptions  ? ?Signed Prescriptions Disp Refills  ? tirzepatide (MOUNJARO) 2.5 MG/0.5ML Pen 2 mL 0  ?  Sig: Inject 2.5 mg into the skin once a week.  ? tirzepatide (MOUNJARO) 5 MG/0.5ML Pen 2 mL 2  ?  Sig: Inject 5 mg into the skin once a week. Start 1 mth after the 2.5 mg  ? ? ?Return in about 3 months (around 07/29/2021) for Appt with [facility] in 4 wks for BS check. ? ?Karle Plumber, MD, FACP ?

## 2021-04-29 NOTE — Patient Instructions (Signed)
Start Mounjaro 2.5 mg once a week for 4 weeks. ?Once you start the Mounjaro, decrease the Lantus insulin to 5 units.  Continue to monitor your blood sugars twice a day. ? ?After you finish the 4 weeks of the 2.5 mg of Mounjaro, the dose will be increased to 5 mg once a week. Once you are on the 5 mg Mounjaro, stop the Lantus insulin.  ?

## 2021-04-30 ENCOUNTER — Ambulatory Visit: Payer: BC Managed Care – PPO

## 2021-04-30 LAB — CERVICOVAGINAL ANCILLARY ONLY
Bacterial Vaginitis (gardnerella): NEGATIVE
Candida Glabrata: NEGATIVE
Candida Vaginitis: NEGATIVE
Chlamydia: NEGATIVE
Comment: NEGATIVE
Comment: NEGATIVE
Comment: NEGATIVE
Comment: NEGATIVE
Comment: NEGATIVE
Comment: NORMAL
Neisseria Gonorrhea: NEGATIVE
Trichomonas: NEGATIVE

## 2021-04-30 LAB — CYTOLOGY - PAP
Comment: NEGATIVE
Diagnosis: NEGATIVE
High risk HPV: NEGATIVE

## 2021-05-01 ENCOUNTER — Ambulatory Visit
Admission: RE | Admit: 2021-05-01 | Discharge: 2021-05-01 | Disposition: A | Discharge status: Home / Self Care | Payer: BC Managed Care – PPO | Source: Ambulatory Visit | Attending: Internal Medicine | Admitting: Internal Medicine

## 2021-05-01 DIAGNOSIS — Z1231 Encounter for screening mammogram for malignant neoplasm of breast: Secondary | ICD-10-CM

## 2021-05-03 ENCOUNTER — Other Ambulatory Visit: Payer: Self-pay | Admitting: Internal Medicine

## 2021-05-03 DIAGNOSIS — R928 Other abnormal and inconclusive findings on diagnostic imaging of breast: Secondary | ICD-10-CM

## 2021-05-20 ENCOUNTER — Ambulatory Visit: Admission: RE | Admit: 2021-05-20 | Payer: BC Managed Care – PPO | Source: Ambulatory Visit

## 2021-05-20 ENCOUNTER — Ambulatory Visit
Admission: RE | Admit: 2021-05-20 | Discharge: 2021-05-20 | Disposition: A | Discharge status: Home / Self Care | Payer: BC Managed Care – PPO | Source: Ambulatory Visit | Attending: Internal Medicine | Admitting: Internal Medicine

## 2021-05-20 DIAGNOSIS — R928 Other abnormal and inconclusive findings on diagnostic imaging of breast: Secondary | ICD-10-CM

## 2021-05-30 ENCOUNTER — Ambulatory Visit: Payer: BC Managed Care – PPO | Admitting: Physician Assistant

## 2021-09-19 ENCOUNTER — Other Ambulatory Visit (HOSPITAL_COMMUNITY): Payer: Self-pay

## 2021-12-04 ENCOUNTER — Encounter (HOSPITAL_COMMUNITY): Payer: Self-pay

## 2021-12-04 ENCOUNTER — Ambulatory Visit (HOSPITAL_COMMUNITY)
Admission: EM | Admit: 2021-12-04 | Discharge: 2021-12-04 | Disposition: A | Discharge status: Home / Self Care | Payer: 59 | Attending: Physician Assistant | Admitting: Physician Assistant

## 2021-12-04 DIAGNOSIS — N898 Other specified noninflammatory disorders of vagina: Secondary | ICD-10-CM

## 2021-12-04 DIAGNOSIS — N760 Acute vaginitis: Secondary | ICD-10-CM

## 2021-12-04 MED ORDER — FLUCONAZOLE 150 MG PO TABS: 150.0000 mg | ORAL_TABLET | ORAL | 0 refills | Status: DC | PRN

## 2021-12-04 NOTE — ED Triage Notes (Signed)
Pt is here for vaginal itching x2 days

## 2021-12-04 NOTE — Discharge Instructions (Signed)
Take fluconazole today.  You can take a second dose in 3 days if your symptoms are not resolved.  Wear loosefitting cotton underwear and use hypoallergenic soaps and detergents.  We will contact you if need to arrange additional treatment.  If you develop any worsening or changing symptoms please return for reevaluation.

## 2021-12-04 NOTE — ED Provider Notes (Signed)
[facility]    CSN: [id number] Arrival date & time: 12/04/21  0801      History   Chief Complaint Chief Complaint  Patient presents with   Vaginal Itching    HPI Sherri Hall is a 46 y.o. female.   Patient presents today with a 2-day history of vaginal irritation/itching as well as discharge.  She describes the discharge as thick and white without odor.  She denies any abdominal pain, pelvic pain, fever, nausea, vomiting, urinary symptoms.  She does not take an SGLT2 inhibitor but does have diabetes.  Reports her blood sugars are generally well controlled and she has been compliant with her medication.  She has tried Monistat over-the-counter without improvement of symptoms.  She has no concern for pregnancy.  Does report that she changed her soap recently and wonders if this could have contributed to symptoms.  Denies any recent antibiotics.    Past Medical History:  Diagnosis Date   Acid reflux     Patient Active Problem List   Diagnosis Date Noted   Hyperlipidemia associated with type 2 diabetes mellitus ([place]) 03/25/2021   Diabetes mellitus type 2 in obese ([place]) 02/01/2021   Elevated blood-pressure reading without diagnosis of hypertension 02/01/2021   Tobacco dependence 02/01/2021   Dental cavities 02/01/2021    Past Surgical History:  Procedure Laterality Date   CHOLECYSTECTOMY      OB History   No obstetric history on file.      Home Medications    Prior to Admission medications   Medication Sig Start Date End Date Taking? Authorizing Provider  fluconazole (DIFLUCAN) 150 MG tablet Take 1 tablet (150 mg total) by mouth every 3 (three) days as needed for up to 2 doses. 12/04/21  Yes Hilario Robarts K, PA-C  atorvastatin (LIPITOR) 20 MG tablet Take 1 tablet (20 mg total) by mouth daily. 02/02/21   Ladell Pier, MD  blood glucose meter kit and supplies KIT Dispense based on patient and insurance preference. Use up to four times daily as  directed. Patient not taking: Reported on 02/01/2021 12/08/20   Hughie Closs, PA-C  insulin glargine (LANTUS SOLOSTAR) 100 UNIT/ML Solostar Pen Inject 10 Units into the skin at bedtime. 02/01/21   Ladell Pier, MD  Insulin Pen Needle 31G X 8 MM MISC Use as directed 02/01/21   Ladell Pier, MD  metFORMIN (GLUCOPHAGE) 500 MG tablet Take 1 tablet (500 mg total) by mouth 2 (two) times daily with a meal. 02/01/21   Ladell Pier, MD  nicotine (NICODERM CQ - DOSED IN MG/24 HOURS) 21 mg/24hr patch Place 1 patch (21 mg total) onto the skin daily. 02/01/21   Ladell Pier, MD  tirzepatide [facility]) 2.5 MG/0.5ML Pen Inject 2.5 mg into the skin once a week. 04/29/21   Ladell Pier, MD  tirzepatide [facility]) 5 MG/0.5ML Pen Inject 5 mg into the skin once a week. Start 1 mth after the 2.5 mg 04/29/21   Ladell Pier, MD  famotidine (PEPCID) 20 MG tablet Take 1 tablet (20 mg total) by mouth 2 (two) times daily. 06/23/13 01/25/19  Cleatrice Burke, PA-C    Family History Family History  Problem Relation Age of Onset   Congestive Heart Failure Mother    Hypertension Mother    Healthy Mother    Hypertension Father    Seizures Father    Congestive Heart Failure Father    Breast cancer Neg Hx     Social History  Social History   Tobacco Use   Smoking status: Every Day    Packs/day: 0.50    Types: Cigarettes   Smokeless tobacco: Never  Vaping Use   Vaping Use: Never used  Substance Use Topics   Alcohol use: No   Drug use: No     Allergies   Patient has no known allergies.   Review of Systems Review of Systems  Constitutional:  Negative for activity change, appetite change, fatigue and fever.  Gastrointestinal:  Negative for abdominal pain, diarrhea, nausea and vomiting.  Genitourinary:  Positive for vaginal discharge. Negative for dysuria, frequency, pelvic pain, urgency, vaginal bleeding and vaginal pain.     Physical Exam Triage Vital Signs ED Triage Vitals   Enc Vitals Group     BP 12/04/21 0824 116/81     Pulse Rate 12/04/21 0824 92     Resp 12/04/21 0824 12     Temp 12/04/21 0824 98.4 F (36.9 C)     Temp Source 12/04/21 0824 Oral     SpO2 12/04/21 0824 99 %     Weight --      Height --      Head Circumference --      Peak Flow --      Pain Score 12/04/21 0816 0     Pain Loc --      Pain Edu? --      Excl. in [place]? --    No data found.  Updated Vital Signs BP 116/81 (BP Location: Right Arm)   Pulse 92   Temp 98.4 F (36.9 C) (Oral)   Resp 12   LMP 11/21/2021   SpO2 99%   Visual Acuity Right Eye Distance:   Left Eye Distance:   Bilateral Distance:    Right Eye Near:   Left Eye Near:    Bilateral Near:     Physical Exam Vitals reviewed.  Constitutional:      General: She is awake. She is not in acute distress.    Appearance: Normal appearance. She is well-developed. She is not ill-appearing.     Comments: Very pleasant female appears stated age in no acute distress sitting comfortably in exam room.  HENT:     Head: Normocephalic and atraumatic.  Cardiovascular:     Rate and Rhythm: Normal rate and regular rhythm.     Heart sounds: Normal heart sounds, S1 normal and S2 normal. No murmur heard. Pulmonary:     Effort: Pulmonary effort is normal.     Breath sounds: Normal breath sounds. No wheezing, rhonchi or rales.     Comments: Clear to auscultation bilaterally Abdominal:     General: Bowel sounds are normal.     Palpations: Abdomen is soft.     Tenderness: There is no abdominal tenderness. There is no right CVA tenderness, left CVA tenderness, guarding or rebound.     Comments: Benign abdominal exam  Genitourinary:    Comments: Exam deferred Psychiatric:        Behavior: Behavior is cooperative.      UC Treatments / Results  Labs (all labs ordered are listed, but only abnormal results are displayed) Labs Reviewed  CERVICOVAGINAL ANCILLARY ONLY    EKG   Radiology No results  found.  Procedures Procedures (including critical care time)  Medications Ordered in UC Medications - No data to display  Initial Impression / Assessment and Plan / UC Course  I have reviewed the triage vital signs and the nursing notes.  Pertinent labs &  imaging results that were available during my care of the patient were reviewed by me and considered in my medical decision making (see chart for details).     Patient is well-appearing, afebrile, nontoxic, nontachycardic.  Will empirically treat for yeast given clinical presentation.  Patient was sent in 2 doses of Diflucan to be used 72 hours apart.  Recommended she use hypoallergenic soaps and detergents and wear loosefitting cotton underwear.  STI swab was collected today and we will contact her if she requires any change to her management.  She is to rest and drink plenty of fluid.  Discussed that if she develops any changing or worsening symptoms including pelvic pain, change in discharge, fever, nausea, vomiting she needs to be seen immediately.  Strict turn precautions given.  Work excuse note provided.  Final Clinical Impressions(s) / UC Diagnoses   Final diagnoses:  Acute vaginitis  Vaginal discharge     Discharge Instructions      Take fluconazole today.  You can take a second dose in 3 days if your symptoms are not resolved.  Wear loosefitting cotton underwear and use hypoallergenic soaps and detergents.  We will contact you if need to arrange additional treatment.  If you develop any worsening or changing symptoms please return for reevaluation.     ED Prescriptions     Medication Sig Dispense Auth. Provider   fluconazole (DIFLUCAN) 150 MG tablet Take 1 tablet (150 mg total) by mouth every 3 (three) days as needed for up to 2 doses. 2 tablet Roberta Angell, Derry Skill, PA-C      PDMP not reviewed this encounter.   Terrilee Croak, PA-C 12/04/21 2179

## 2021-12-05 LAB — CERVICOVAGINAL ANCILLARY ONLY
Comment: NEGATIVE
Comment: NEGATIVE
Comment: NEGATIVE
Comment: NEGATIVE
Comment: NEGATIVE
Comment: NORMAL

## 2021-12-24 ENCOUNTER — Ambulatory Visit: Payer: Self-pay | Admitting: *Deleted

## 2021-12-24 ENCOUNTER — Telehealth: Payer: 59 | Admitting: Physician Assistant

## 2021-12-24 DIAGNOSIS — B3731 Acute candidiasis of vulva and vagina: Secondary | ICD-10-CM

## 2021-12-24 MED ORDER — FLUCONAZOLE 150 MG PO TABS: 150.0000 mg | ORAL_TABLET | ORAL | 0 refills | Status: AC

## 2021-12-24 NOTE — Progress Notes (Signed)
Virtual Visit Consent   Sherri Hall, you are scheduled for a virtual visit with a [facility] provider today. Just as with appointments in the office, your consent must be obtained to participate. Your consent will be active for this visit and any virtual visit you may have with one of our providers in the next 365 days. If you have a MyChart account, a copy of this consent can be sent to you electronically.  As this is a virtual visit, video technology does not allow for your provider to perform a traditional examination. This may limit your provider's ability to fully assess your condition. If your provider identifies any concerns that need to be evaluated in person or the need to arrange testing (such as labs, EKG, etc.), we will make arrangements to do so. Although advances in technology are sophisticated, we cannot ensure that it will always work on either your end or our end. If the connection with a video visit is poor, the visit may have to be switched to a telephone visit. With either a video or telephone visit, we are not always able to ensure that we have a secure connection.  By engaging in this virtual visit, you consent to the provision of healthcare and authorize for your insurance to be billed (if applicable) for the services provided during this visit. Depending on your insurance coverage, you may receive a charge related to this service.  I need to obtain your verbal consent now. Are you willing to proceed with your visit today? Sherri Hall has provided verbal consent on 12/24/2021 for a virtual visit (video or telephone). Leeanne Rio, [place]  Date: 12/24/2021 5:50 PM  Virtual Visit via Video Note   I, Leeanne Rio, connected with  Sherri Hall  ([id number], 1975/06/12) on 12/24/21 at  5:45 PM EST by a video-enabled telemedicine application and verified that I am speaking with the correct person using two identifiers.  Location: Patient: Virtual  Visit Location Patient: Home Provider: Virtual Visit Location Provider: Home Office   I discussed the limitations of evaluation and management by telemedicine and the availability of in person appointments. The patient expressed understanding and agreed to proceed.    History of Present Illness: Sherri Hall is a 46 y.o. who identifies as a female who was assigned female at birth, and is being seen today for possible yeast infection. Notes several days of vaginal pruritus with a thick, white discharge. Denies abdominal pain, fever, chills, urinary symptoms. Has history of vaginal yeast infections especially since her DM II diagnosis. Was seen at Urgent Care at beginning of the month and treated for a yeast vaginitis but she notes gave her a shorter course than she usually needs. Symptoms improved but she does not feel it fully went away prior to recent increase in symptoms. Denies any concern for STI or pregnancy.   HPI: HPI  Problems:  Patient Active Problem List   Diagnosis Date Noted   Hyperlipidemia associated with type 2 diabetes mellitus ([place]) 03/25/2021   Diabetes mellitus type 2 in obese ([place]) 02/01/2021   Elevated blood-pressure reading without diagnosis of hypertension 02/01/2021   Tobacco dependence 02/01/2021   Dental cavities 02/01/2021    Allergies: No Known Allergies Medications:  Current Outpatient Medications:    fluconazole (DIFLUCAN) 150 MG tablet, Take 1 tablet (150 mg total) by mouth every 3 (three) days for 3 doses., Disp: 3 tablet, Rfl: 0   atorvastatin (LIPITOR) 20 MG tablet, Take 1 tablet (  20 mg total) by mouth daily., Disp: 30 tablet, Rfl: 3   blood glucose meter kit and supplies KIT, Dispense based on patient and insurance preference. Use up to four times daily as directed. (Patient not taking: Reported on 02/01/2021), Disp: 1 each, Rfl: 0   insulin glargine (LANTUS SOLOSTAR) 100 UNIT/ML Solostar Pen, Inject 10 Units into the skin at bedtime., Disp: 15 mL, Rfl:  1   Insulin Pen Needle 31G X 8 MM MISC, Use as directed, Disp: 100 each, Rfl: 6   metFORMIN (GLUCOPHAGE) 500 MG tablet, Take 1 tablet (500 mg total) by mouth 2 (two) times daily with a meal., Disp: 60 tablet, Rfl: 5   nicotine (NICODERM CQ - DOSED IN MG/24 HOURS) 21 mg/24hr patch, Place 1 patch (21 mg total) onto the skin daily., Disp: 28 patch, Rfl: 1   tirzepatide (MOUNJARO) 2.5 MG/0.5ML Pen, Inject 2.5 mg into the skin once a week., Disp: 2 mL, Rfl: 0   tirzepatide (MOUNJARO) 5 MG/0.5ML Pen, Inject 5 mg into the skin once a week. Start 1 mth after the 2.5 mg, Disp: 2 mL, Rfl: 2  Observations/Objective: Patient is well-developed, well-nourished in no acute distress.  Resting comfortably at home.  Head is normocephalic, atraumatic.  No labored breathing. Speech is clear and coherent with logical content.  Patient is alert and oriented at baseline.   Assessment and Plan: 1. Yeast vaginitis - fluconazole (DIFLUCAN) 150 MG tablet; Take 1 tablet (150 mg total) by mouth every 3 (three) days for 3 doses.  Dispense: 3 tablet; Refill: 0  Supportive measures discussed. Women's health probiotic recommended. Make sure to be consistent with diabetic care and follow-ups with PCP to keep A1C at goal. Will give Diflucan 150 mg every 72 hours for 3 doses. In person follow-up for any residual symptoms.   Lab Results  Component Value Date   HGBA1C 9.9 (A) 02/01/2021     Follow Up Instructions: I discussed the assessment and treatment plan with the patient. The patient was provided an opportunity to ask questions and all were answered. The patient agreed with the plan and demonstrated an understanding of the instructions.  A copy of instructions were sent to the patient via MyChart unless otherwise noted below.   The patient was advised to call back or seek an in-person evaluation if the symptoms worsen or if the condition fails to improve as anticipated.  Time:  I spent 10 minutes with the patient  via telehealth technology discussing the above problems/concerns.    Leeanne Rio, PA-C

## 2021-12-24 NOTE — Telephone Encounter (Signed)
  Chief Complaint: Vaginal yeast infection Symptoms: Itching, small amount of discharge Frequency: 2 days ago Pertinent Negatives: Patient denies Pain Disposition: [] ED /[] Urgent Care (no appt availability in office) / [x] Appointment(In office/virtual)/ []  [facility]/ [] Home Care/ [] Refused Recommended Disposition /[] [facility]/ []  Follow-up with PCP Additional Notes: Pt had a yeast infection and was seen at [facility] on 12/04/2021. Pt was given 2 doses of Diflucan, with limited relief. Pt also used Monistat which increased itching. At [facility] pt was swabbed and was told that if there was something other than a yeast infection they would contact her. Pt has also had a lot of stress with the recent passing of her brother.     Summary: Possible yeast infection   Pt believes she has a yeast infection since two days ago. No appts available soon enough, wants to be seen by a provider.  Best contact: [phone]       Reason for Disposition  [1] Vaginal itching AND [2] not improved > 3 days following Care Advice  Answer Assessment - Initial Assessment Questions 1. SYMPTOM: "What's the main symptom you're concerned about?" (e.g., pain, itching, dryness)     Itching - some discharge 2. LOCATION: "Where is the   located?" (e.g., inside/outside, left/right)      3. ONSET: "When did the    start?"     Day before yesterday 4. PAIN: "Is there any pain?" If Yes, ask: "How bad is it?" (Scale: 1-10; mild, moderate, severe)   -  MILD (1-3): Doesn't interfere with normal activities.    -  MODERATE (4-7): Interferes with normal activities (e.g., work or school) or awakens from sleep.     -  SEVERE (8-10): Excruciating pain, unable to do any normal activities.      5. ITCHING: "Is there any itching?" If Yes, ask: "How bad is it?" (Scale: 1-10; mild, moderate, severe)     7/10 6. CAUSE: "What do you think is causing the discharge?" "Have you had the same problem before? What happened  then?"     Yeast infection 7. OTHER SYMPTOMS: "Do you have any other symptoms?" (e.g., fever, itching, vaginal bleeding, pain with urination, injury to genital area, vaginal foreign body)      8. PREGNANCY: "Is there any chance you are pregnant?" "When was your last menstrual period?"  Protocols used: Vaginal Symptoms-A-AH

## 2021-12-24 NOTE — Telephone Encounter (Signed)
ummary: Possible yeast infection   Pt believes she has a yeast infection since two days ago. No appts available soon enough, wants to be seen by a provider.  Best contact: [phone]

## 2021-12-24 NOTE — Patient Instructions (Signed)
Abram Sander, thank you for joining Leeanne Rio, PA-C for today's virtual visit.  While this provider is not your primary care provider (PCP), if your PCP is located in our provider database this encounter information will be shared with them immediately following your visit.   [facility] account gives you access to today's visit and all your visits, tests, and labs performed at [facility] " click here if you don't have a [facility] account or go to [email or web address]  Consent: (Patient) Abram Sander provided verbal consent for this virtual visit at the beginning of the encounter.  Current Medications:  Current Outpatient Medications:    fluconazole (DIFLUCAN) 150 MG tablet, Take 1 tablet (150 mg total) by mouth every 3 (three) days for 3 doses., Disp: 3 tablet, Rfl: 0   atorvastatin (LIPITOR) 20 MG tablet, Take 1 tablet (20 mg total) by mouth daily., Disp: 30 tablet, Rfl: 3   blood glucose meter kit and supplies KIT, Dispense based on patient and insurance preference. Use up to four times daily as directed. (Patient not taking: Reported on 02/01/2021), Disp: 1 each, Rfl: 0   insulin glargine (LANTUS SOLOSTAR) 100 UNIT/ML Solostar Pen, Inject 10 Units into the skin at bedtime., Disp: 15 mL, Rfl: 1   Insulin Pen Needle 31G X 8 MM MISC, Use as directed, Disp: 100 each, Rfl: 6   metFORMIN (GLUCOPHAGE) 500 MG tablet, Take 1 tablet (500 mg total) by mouth 2 (two) times daily with a meal., Disp: 60 tablet, Rfl: 5   nicotine (NICODERM CQ - DOSED IN MG/24 HOURS) 21 mg/24hr patch, Place 1 patch (21 mg total) onto the skin daily., Disp: 28 patch, Rfl: 1   tirzepatide (MOUNJARO) 2.5 MG/0.5ML Pen, Inject 2.5 mg into the skin once a week., Disp: 2 mL, Rfl: 0   tirzepatide (MOUNJARO) 5 MG/0.5ML Pen, Inject 5 mg into the skin once a week. Start 1 mth after the 2.5 mg, Disp: 2 mL, Rfl: 2   Medications ordered in this encounter:  Meds ordered this  encounter  Medications   fluconazole (DIFLUCAN) 150 MG tablet    Sig: Take 1 tablet (150 mg total) by mouth every 3 (three) days for 3 doses.    Dispense:  3 tablet    Refill:  0    Order Specific Question:   Supervising Provider    Answer:   Chase Picket [id number]     *If you need refills on other medications prior to your next appointment, please contact your pharmacy*  Follow-Up: Call back or seek an in-person evaluation if the symptoms worsen or if the condition fails to improve as anticipated.  [facility] [phone]  Other Instructions Please take the Diflucan as directed. Consider starting a Women's health probiotic (One a Day makes one as well as AZO) Keep following up with your PCP to keep diabetes under control to lower risk of subsequent yeast infections.  If symptoms are not resolving or you note new/worsening symptoms despite treatment, you need an in-person evaluation. DO NOT DELAY CARE.   If you have been instructed to have an in-person evaluation today at a local Urgent Care facility, please use the link below. It will take you to a list of all of our available [facility], including address, phone number and hours of operation. Please do not delay care.  [facility]  If you or a family member do not have a primary care  provider, use the link below to schedule a visit and establish care. When you choose a [facility] primary care physician or advanced practice provider, you gain a long-term partner in health. Find a Primary Care Provider  Learn more about [facility]'s in-office and virtual care options: [facility]

## 2022-01-24 ENCOUNTER — Ambulatory Visit: Payer: Self-pay

## 2022-01-24 ENCOUNTER — Telehealth: Payer: 59 | Admitting: Nurse Practitioner

## 2022-01-24 DIAGNOSIS — B3731 Acute candidiasis of vulva and vagina: Secondary | ICD-10-CM

## 2022-01-24 MED ORDER — FLUCONAZOLE 150 MG PO TABS: ORAL_TABLET | ORAL | 0 refills | Status: DC

## 2022-01-24 NOTE — Progress Notes (Signed)
Virtual Visit Consent   Sherri Hall, you are scheduled for a virtual visit with Mary-Margaret Hassell Done, [place], a [facility] provider, today.     Just as with appointments in the office, your consent must be obtained to participate.  Your consent will be active for this visit and any virtual visit you may have with one of our providers in the next 365 days.     If you have a MyChart account, a copy of this consent can be sent to you electronically.  All virtual visits are billed to your insurance company just like a traditional visit in the office.    As this is a virtual visit, video technology does not allow for your provider to perform a traditional examination.  This may limit your provider's ability to fully assess your condition.  If your provider identifies any concerns that need to be evaluated in person or the need to arrange testing (such as labs, EKG, etc.), we will make arrangements to do so.     Although advances in technology are sophisticated, we cannot ensure that it will always work on either your end or our end.  If the connection with a video visit is poor, the visit may have to be switched to a telephone visit.  With either a video or telephone visit, we are not always able to ensure that we have a secure connection.     I need to obtain your verbal consent now.   Are you willing to proceed with your visit today? YES   MIKIYAH GLASNER has provided verbal consent on 01/24/2022 for a virtual visit (video or telephone).   Mary-Margaret Hassell Done, FNP   Date: 01/24/2022 6:10 PM   Virtual Visit via Video Note   I, Mary-Margaret Hassell Done, connected with Sherri Hall ([id number], [birth year]) on 01/24/22 at  6:30 PM EST by a video-enabled telemedicine application and verified that I am speaking with the correct person using two identifiers.  Location: Patient: Virtual Visit Location Patient: Home Provider: Virtual Visit Location Provider: Mobile   I discussed the  limitations of evaluation and management by telemedicine and the availability of in person appointments. The patient expressed understanding and agreed to proceed.    History of Present Illness: Sherri Hall is a 46 y.o. who identifies as a female who was assigned female at birth, and is being seen today for yeast.  HPI: Patient has been having trouble with recurrent yeast infections since September. Last one was in NOvember and she took diflucan tablet for 3 days. Has not had reoccurence since then. She started her period today and is now having lots of burning and itching.    Review of Systems  Genitourinary:  Negative for dysuria, flank pain, frequency and urgency.    Problems:  Patient Active Problem List   Diagnosis Date Noted   Hyperlipidemia associated with type 2 diabetes mellitus ([place]) 03/25/2021   Diabetes mellitus type 2 in obese ([place]) 02/01/2021   Elevated blood-pressure reading without diagnosis of hypertension 02/01/2021   Tobacco dependence 02/01/2021   Dental cavities 02/01/2021    Allergies: No Known Allergies Medications:  Current Outpatient Medications:    atorvastatin (LIPITOR) 20 MG tablet, Take 1 tablet (20 mg total) by mouth daily., Disp: 30 tablet, Rfl: 3   blood glucose meter kit and supplies KIT, Dispense based on patient and insurance preference. Use up to four times daily as directed. (Patient not taking: Reported on 02/01/2021), Disp: 1 each, Rfl: 0  insulin glargine (LANTUS SOLOSTAR) 100 UNIT/ML Solostar Pen, Inject 10 Units into the skin at bedtime., Disp: 15 mL, Rfl: 1   Insulin Pen Needle 31G X 8 MM MISC, Use as directed, Disp: 100 each, Rfl: 6   metFORMIN (GLUCOPHAGE) 500 MG tablet, Take 1 tablet (500 mg total) by mouth 2 (two) times daily with a meal., Disp: 60 tablet, Rfl: 5   nicotine (NICODERM CQ - DOSED IN MG/24 HOURS) 21 mg/24hr patch, Place 1 patch (21 mg total) onto the skin daily., Disp: 28 patch, Rfl: 1   tirzepatide (MOUNJARO) 2.5  MG/0.5ML Pen, Inject 2.5 mg into the skin once a week., Disp: 2 mL, Rfl: 0   tirzepatide (MOUNJARO) 5 MG/0.5ML Pen, Inject 5 mg into the skin once a week. Start 1 mth after the 2.5 mg, Disp: 2 mL, Rfl: 2  Observations/Objective: Patient is well-developed, well-nourished in no acute distress.  Resting comfortably  at home.  Head is normocephalic, atraumatic.  No labored breathing.  Speech is clear and coherent with logical content.  Patient is alert and oriented at baseline.    Assessment and Plan:  [name] in today with chief complaint of Vaginitis   1. Vaginal candidiasis Force fluids No bubble baths No harsh soaps in perineal area If reoccurs needs to see GYN Meds ordered this encounter  Medications   fluconazole (DIFLUCAN) 150 MG tablet    Sig: 1 po q week x 4 weeks    Dispense:  4 tablet    Refill:  0    Order Specific Question:   Supervising Provider    Answer:   Chase Picket [id number]      Follow Up Instructions: I discussed the assessment and treatment plan with the patient. The patient was provided an opportunity to ask questions and all were answered. The patient agreed with the plan and demonstrated an understanding of the instructions.  A copy of instructions were sent to the patient via MyChart.  The patient was advised to call back or seek an in-person evaluation if the symptoms worsen or if the condition fails to improve as anticipated.  Time:  I spent 7 minutes with the patient via telehealth technology discussing the above problems/concerns.    Mary-Margaret Hassell Done, FNP

## 2022-01-24 NOTE — Telephone Encounter (Signed)
  Chief Complaint: yeast infection Symptoms: vaginal itching and irritation, slight discharge  Frequency: ongoing since November Pertinent Negatives: Patient denies odor  Disposition: [] ED /[] Urgent Care (no appt availability in office) / [x] Appointment(In office/virtual)/ [x]  [facility]/ [] Home Care/ [] Refused Recommended Disposition /[]  Mobile Bus/ []  Follow-up with PCP Additional Notes: pt has been given Fluconazole on 12/04/21 and 12/24/21, pt states that it helps with sx but then once stopped comes back. Pt hasn't changed any laundry or bath products and unsure why she keeps having them. No appts available today and pt states UC too expensive. Scheduled virtual UC today at 1830 and also OV on 02/20/22 to FU.   Summary: Yeast infection   The patient called in stating she has had multiple yeast infections lately. She had her most recent one a couple of weeks ago and got medication which helped to clear it up but it came back again. She is wondering what else she can do about it. She is having major irritation. Please assist patient further.         Reason for Disposition  MODERATE-SEVERE itching (i.e., interferes with school, work, or sleep)  Answer Assessment - Initial Assessment Questions 1. SYMPTOM: "What's the main symptom you're concerned about?" (e.g., rash, itching, swelling, dryness)     Itching and irritation 2. LOCATION: "Where is the  sx located?" (e.g., inside/outside, left/right)     Vaginally  3. ONSET: "When did the  sx  start?"     2 days  5. CAUSE: "What do you think is causing the symptoms?"     Recurrent yeast infections  6. OTHER SYMPTOMS: "Do you have any other symptoms?" (e.g., fever, vaginal bleeding, pain with urination)  Protocols used: [place] Symptoms-A-AH

## 2022-01-24 NOTE — Patient Instructions (Signed)
  Sherri Hall, thank you for joining [name], FNP for today's virtual visit.  While this provider is not your primary care provider (PCP), if your PCP is located in our provider database this encounter information will be shared with them immediately following your visit.   [facility] account gives you access to today's visit and all your visits, tests, and labs performed at [facility] " click here if you don't have a [facility] account or go to [email or web address]  Consent: (Patient) Sherri Hall provided verbal consent for this virtual visit at the beginning of the encounter.  Current Medications:  Current Outpatient Medications:    fluconazole (DIFLUCAN) 150 MG tablet, 1 po q week x 4 weeks, Disp: 4 tablet, Rfl: 0   atorvastatin (LIPITOR) 20 MG tablet, Take 1 tablet (20 mg total) by mouth daily., Disp: 30 tablet, Rfl: 3   blood glucose meter kit and supplies KIT, Dispense based on patient and insurance preference. Use up to four times daily as directed. (Patient not taking: Reported on 02/01/2021), Disp: 1 each, Rfl: 0   insulin glargine (LANTUS SOLOSTAR) 100 UNIT/ML Solostar Pen, Inject 10 Units into the skin at bedtime., Disp: 15 mL, Rfl: 1   Insulin Pen Needle 31G X 8 MM MISC, Use as directed, Disp: 100 each, Rfl: 6   metFORMIN (GLUCOPHAGE) 500 MG tablet, Take 1 tablet (500 mg total) by mouth 2 (two) times daily with a meal., Disp: 60 tablet, Rfl: 5   nicotine (NICODERM CQ - DOSED IN MG/24 HOURS) 21 mg/24hr patch, Place 1 patch (21 mg total) onto the skin daily., Disp: 28 patch, Rfl: 1   tirzepatide (MOUNJARO) 2.5 MG/0.5ML Pen, Inject 2.5 mg into the skin once a week., Disp: 2 mL, Rfl: 0   tirzepatide (MOUNJARO) 5 MG/0.5ML Pen, Inject 5 mg into the skin once a week. Start 1 mth after the 2.5 mg, Disp: 2 mL, Rfl: 2   Medications ordered in this encounter:  Meds ordered this encounter  Medications   fluconazole (DIFLUCAN) 150  MG tablet    Sig: 1 po q week x 4 weeks    Dispense:  4 tablet    Refill:  0    Order Specific Question:   Supervising Provider    Answer:   Chase Picket [id number]     *If you need refills on other medications prior to your next appointment, please contact your pharmacy*  Follow-Up: Call back or seek an in-person evaluation if the symptoms worsen or if the condition fails to improve as anticipated.  [place] [phone]  Other Instructions Force fluids Avoid bubble baths No harsh soaps on perineal area If reoccurs needs to see GYN   If you have been instructed to have an in-person evaluation today at a local Urgent Care facility, please use the link below. It will take you to a list of all of our available [facility], including address, phone number and hours of operation. Please do not delay care.  [facility]  If you or a family member do not have a primary care provider, use the link below to schedule a visit and establish care. When you choose a [facility] primary care physician or advanced practice provider, you gain a long-term partner in health. Find a Primary Care Provider  Learn more about 's in-office and virtual care options: [facility]

## 2022-01-24 NOTE — Telephone Encounter (Signed)
Noted  

## 2022-02-20 ENCOUNTER — Ambulatory Visit: Payer: 59 | Admitting: Physician Assistant

## 2022-02-20 DIAGNOSIS — E669 Obesity, unspecified: Secondary | ICD-10-CM

## 2022-04-07 ENCOUNTER — Ambulatory Visit: Payer: 59 | Admitting: Physician Assistant

## 2022-04-10 ENCOUNTER — Ambulatory Visit: Payer: 59 | Attending: Physician Assistant | Admitting: Physician Assistant

## 2022-04-10 ENCOUNTER — Encounter: Payer: Self-pay | Admitting: Physician Assistant

## 2022-04-10 VITALS — BP 142/82 | HR 96 | Wt 177.6 lb

## 2022-04-10 DIAGNOSIS — R030 Elevated blood-pressure reading, without diagnosis of hypertension: Secondary | ICD-10-CM | POA: Diagnosis not present

## 2022-04-10 DIAGNOSIS — E1165 Type 2 diabetes mellitus with hyperglycemia: Secondary | ICD-10-CM | POA: Diagnosis not present

## 2022-04-10 DIAGNOSIS — Z91199 Patient's noncompliance with other medical treatment and regimen due to unspecified reason: Secondary | ICD-10-CM

## 2022-04-10 DIAGNOSIS — E1169 Type 2 diabetes mellitus with other specified complication: Secondary | ICD-10-CM | POA: Diagnosis not present

## 2022-04-10 DIAGNOSIS — E785 Hyperlipidemia, unspecified: Secondary | ICD-10-CM

## 2022-04-10 DIAGNOSIS — H6121 Impacted cerumen, right ear: Secondary | ICD-10-CM

## 2022-04-10 LAB — GLUCOSE, POCT (MANUAL RESULT ENTRY)
POC Glucose: 416 mg/dl — AB (ref 70–99)
POC Glucose: 322 mg/dl — AB (ref 70–99)

## 2022-04-10 LAB — POCT GLYCOSYLATED HEMOGLOBIN (HGB A1C): HbA1c, POC (controlled diabetic range): 13.3 % — AB (ref 0.0–7.0)

## 2022-04-10 MED ORDER — DEXCOM G6 SENSOR MISC
1.0000 | Freq: Two times a day (BID) | 10 refills | Status: AC
Start: 2022-04-10 — End: ?

## 2022-04-10 MED ORDER — INSULIN ASPART 100 UNIT/ML IJ SOLN
20.0000 [IU] | Freq: Once | INTRAMUSCULAR | Status: AC
  Administered 2022-04-10: 20 [IU] via SUBCUTANEOUS

## 2022-04-10 MED ORDER — INSULIN PEN NEEDLE 31G X 8 MM MISC: 6 refills | Status: DC

## 2022-04-10 MED ORDER — DEXCOM G6 RECEIVER DEVI: 1.0000 | Freq: Two times a day (BID) | 0 refills | Status: DC

## 2022-04-10 MED ORDER — ATORVASTATIN CALCIUM 20 MG PO TABS: 20.0000 mg | ORAL_TABLET | Freq: Every day | ORAL | 3 refills | Status: DC

## 2022-04-10 MED ORDER — METFORMIN HCL 500 MG PO TABS: 500.0000 mg | ORAL_TABLET | Freq: Two times a day (BID) | ORAL | 5 refills | Status: DC

## 2022-04-10 MED ORDER — LANTUS SOLOSTAR 100 UNIT/ML ~~LOC~~ SOPN: 20.0000 [IU] | PEN_INJECTOR | Freq: Every day | SUBCUTANEOUS | 3 refills | Status: DC

## 2022-04-10 MED ORDER — FLUCONAZOLE 150 MG PO TABS: ORAL_TABLET | ORAL | 0 refills | Status: DC

## 2022-04-10 NOTE — Patient Instructions (Signed)
Check blood sugars fasting and bedtime if you are unable to obtain the dexicom meter.  Set a reminder for your lantus dose.  You can either choose to take this in the mornings or the evenings-just be consistent with which time you choose.  Limit sugar/refined carbohydrates.  Work at a goal of eliminating sugary drinks, candy, desserts, sweets, refined sugars, processed foods, and white carbohydrates.    Check blood pressure out of the office 3 to 4 times weekly and record.   Drink 80-100 ounces water daily

## 2022-04-10 NOTE — Progress Notes (Signed)
Patient ID: Sherri Hall, female   DOB: 1975-08-07, 47 y.o.   MRN: [id number]   Sherri Hall, is a 47 y.o. female  [id number]  [id number]  DOB - 1975/03/27  Chief Complaint  Patient presents with   Vaginitis   Diabetes       Subjective:   Sherri Hall is a 47 y.o. female here today for med RF.  She has been out of metformin for many months.  She has had lantus but she has been rationing it to make it last longer so she has only been taking 10 u lantus about every other evening.  She says she often forgets to take it bc of fatigue.  She is wanting to "get myself and health together."  Admits to poor diet and drinks a lot of sodas.    No problems updated.  ALLERGIES: No Known Allergies  PAST MEDICAL HISTORY: Past Medical History:  Diagnosis Date   Acid reflux     MEDICATIONS AT HOME: Prior to Admission medications   Medication Sig Start Date End Date Taking? Authorizing Provider  blood glucose meter kit and supplies KIT Dispense based on patient and insurance preference. Use up to four times daily as directed. 12/08/20  Yes Covington, Sarah M, PA-C  Continuous Blood Gluc Receiver ([place]) [place] 1 each by Does not apply route 2 (two) times daily. 04/10/22  Yes Kennieth Plotts, Dionne Bucy, PA-C  Continuous Blood Gluc Sensor (DEXCOM G6 SENSOR) MISC 1 each by Does not apply route 2 (two) times daily. 04/10/22  Yes Abdul Beirne M, PA-C  nicotine (NICODERM CQ - DOSED IN MG/24 HOURS) 21 mg/24hr patch Place 1 patch (21 mg total) onto the skin daily. 02/01/21  Yes Ladell Pier, MD  atorvastatin (LIPITOR) 20 MG tablet Take 1 tablet (20 mg total) by mouth daily. 04/10/22   Argentina Donovan, PA-C  fluconazole (DIFLUCAN) 150 MG tablet 1 po q week x 4 weeks 04/10/22   Argentina Donovan, PA-C  insulin glargine (LANTUS SOLOSTAR) 100 UNIT/ML Solostar Pen Inject 20 Units into the skin at bedtime. 04/10/22   Argentina Donovan, PA-C  Insulin Pen Needle 31G X 8 MM MISC Use  as directed 04/10/22   Argentina Donovan, PA-C  metFORMIN (GLUCOPHAGE) 500 MG tablet Take 1 tablet (500 mg total) by mouth 2 (two) times daily with a meal. 04/10/22   Manford Sprong, Dionne Bucy, PA-C  famotidine (PEPCID) 20 MG tablet Take 1 tablet (20 mg total) by mouth 2 (two) times daily. 06/23/13 01/25/19  Cleatrice Burke, PA-C    ROS: Decreased hearing in R ear Neg resp Neg cardiac Neg GI Neg GU Neg MS Neg psych Neg neuro  Objective:   Vitals:   04/10/22 1120  BP: (!) 142/82  Pulse: 96  SpO2: 100%  Weight: 177 lb 9.6 oz (80.6 kg)   Exam General appearance : Awake, alert, not in any distress. Speech Clear. Not toxic looking HEENT: Atraumatic and Normocephalic R TM blocked with soft/yellow cerumen, R TM WNL and no cerumen build up in canal.   Neck: Supple, no JVD. No cervical lymphadenopathy.  Chest: Good air entry bilaterally, CTAB.  No rales/rhonchi/wheezing CVS: S1 S2 regular, no murmurs.  Extremities: B/L Lower Ext shows no edema, both legs are warm to touch Neurology: Awake alert, and oriented X 3, CN II-XII intact, Non focal Skin: No Rash  Data Review Lab Results  Component Value Date   HGBA1C 13.3 (A) 04/10/2022   HGBA1C 9.9 (A) 02/01/2021  Assessment & Plan   1. Type 2 diabetes mellitus with hyperglycemia, unspecified whether long term insulin use (HCC) Uncontrolled. spent >25 mins face to face and discussing pathogenesis and poor outcomes with uncontrolled diabetes.   Check blood sugars fasting and bedtime if you are unable to obtain the dexicom meter.  Set a reminder for your lantus dose.  You can either choose to take this in the mornings or the evenings-just be consistent with which time you choose.  Limit sugar/refined carbohydrates.  Work at a goal of eliminating sugary drinks, candy, desserts, sweets, refined sugars, processed foods, and white carbohydrates.   - Glucose (CBG) - HgB A1c - insulin aspart (novoLOG) injection 20 Units - POCT URINALYSIS DIP  (CLINITEK) - Comprehensive metabolic panel; Future - Lipid panel; Future - CBC with Differential/Platelet; Future - fluconazole (DIFLUCAN) 150 MG tablet; 1 po q week x 4 weeks  Dispense: 4 tablet; Refill: 0 - Continuous Blood Gluc Receiver ([place]) DEVI; 1 each by Does not apply route 2 (two) times daily.  Dispense: 1 each; Refill: 0 - Continuous Blood Gluc Sensor (DEXCOM G6 SENSOR) MISC; 1 each by Does not apply route 2 (two) times daily.  Dispense: 3 each; Refill: 10 - insulin glargine (LANTUS SOLOSTAR) 100 UNIT/ML Solostar Pen; Inject 20 Units into the skin at bedtime.  Dispense: 15 mL; Refill: 3 - metFORMIN (GLUCOPHAGE) 500 MG tablet; Take 1 tablet (500 mg total) by mouth 2 (two) times daily with a meal.  Dispense: 60 tablet; Refill: 5 - Insulin Pen Needle 31G X 8 MM MISC; Use as directed  Dispense: 100 each; Refill: 6  2. Hyperlipidemia associated with type 2 diabetes mellitus ([place]) - Lipid panel; Future - atorvastatin (LIPITOR) 20 MG tablet; Take 1 tablet (20 mg total) by mouth daily.  Dispense: 30 tablet; Refill: 3  3. Elevated blood-pressure reading without diagnosis of hypertension Check blood pressure out of the office 3 to 4 times weekly and record. Low dose ace or arb if elevated  4. Noncompliance Compliance imperative-discussed at length  5. Impacted cerumen of right ear - Ambulatory referral to ENT Appeared soft but attempted to irrigate without success    Return for [facility] in 1 month and Dr Johnson(PCP) in 3 months.  The patient was given clear instructions to go to ER or return to medical center if symptoms don't improve, worsen or new problems develop. The patient verbalized understanding. The patient was told to call to get lab results if they haven't heard anything in the next week.      Freeman Caldron, PA-C [facility] and [facility] [place], [place]   04/10/2022, 11:51 AM

## 2022-04-15 ENCOUNTER — Telehealth: Payer: Self-pay

## 2022-04-15 NOTE — Telephone Encounter (Signed)
Called patent and left voicemail to redo a lab(swab) for [place] again

## 2022-05-06 ENCOUNTER — Telehealth: Payer: Self-pay | Admitting: Internal Medicine

## 2022-05-06 NOTE — Telephone Encounter (Signed)
Copied from CRM [id number]. Topic: Referral - Status >> May 06, 2022 10:33 AM Macon Large wrote: Reason for CRM: Pt called for update on referral to ENT because she has yet hear back from anyone regarding an appt. Cb# [phone]

## 2022-05-16 ENCOUNTER — Encounter: Payer: Self-pay | Admitting: Pharmacist

## 2022-05-16 ENCOUNTER — Ambulatory Visit: Payer: 59 | Attending: Internal Medicine | Admitting: Pharmacist

## 2022-05-16 DIAGNOSIS — E1165 Type 2 diabetes mellitus with hyperglycemia: Secondary | ICD-10-CM | POA: Diagnosis not present

## 2022-05-16 MED ORDER — METFORMIN HCL ER 500 MG PO TB24: 500.0000 mg | ORAL_TABLET | Freq: Two times a day (BID) | ORAL | 0 refills | Status: DC

## 2022-05-16 MED ORDER — LANTUS SOLOSTAR 100 UNIT/ML ~~LOC~~ SOPN: 24.0000 [IU] | PEN_INJECTOR | Freq: Every day | SUBCUTANEOUS | 3 refills | Status: DC

## 2022-05-16 NOTE — Progress Notes (Signed)
S:     47 y.o. female who presents for diabetes evaluation, education, and management.  PMH is significant for T2DM, HLD, elevated BP w/o hypertension.  Patient was referred and last seen by Georgian Co, on 04/10/2022. At that visit, A1c has increased from 9.9% to 13.3%. Patient reported had been trying to stretch Lantus and had been taking every other day and endorsed drinking sodas. At that visit, Lantus 20 units daily and metformin were continued. Patient was last seen by Primary Care Provider, Dr. Laural Benes, on 04/29/2021.   Today, patient arrives in good spirits and presents without any assistance. Patient was previously prescribed Mounjaro and was not started due to cost. She reports GI side effects with metformin. Since her visit with Dr. Laural Benes she reports staying away from sugar and cutting back on soda. Also, walking everyday and joined a gym. She has Dexcom 6, but has not been able to put it on yet. Plans to have a nurse at her work help her with it.   Patient reports Diabetes was diagnosed a year ago after having multiple yeast infections.  Family/Social History:  Mother - CHF, HTN Father - HTN, CHF  Current diabetes medications include: Lantus 20 units daily, metformin 500 mg BID  Current hyperlipidemia medications include: atorvastatin 20 mg daily  Patient reports adherence to taking all medications as prescribed. No missed doses since last visit.  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  Reported home fasting blood sugars: 140-180  Checking 2 times a day. Lower in the morning, Higher at night.  Patient reports nocturia (nighttime urination). 3-4 times overnight.  Patient denies neuropathy (nerve pain). Patient denies visual changes. Patient reports self foot exams.   Patient reported dietary habits:  Breakfast - fruit, yogurt Lunch/dinner - baked fish or chicken with salads or vegetables Snacks on granola bars Trying to stay  away from sugars Likes drinking regular sodas and coffee with creamer - Reports has got down on sodas from 4-5 a day to 1 a day - Reports only using creamer in her coffee instead of creamer and sugar  Patient-reported exercise habits: walking, signed up for membership at the gym  O:   Lab Results  Component Value Date   HGBA1C 13.3 (A) 04/10/2022   There were no vitals filed for this visit.  Lipid Panel     Component Value Date/Time   CHOL 174 02/01/2021 1004   TRIG 89 02/01/2021 1004   HDL 43 02/01/2021 1004   CHOLHDL 4.0 02/01/2021 1004   LDLCALC 114 (H) 02/01/2021 1004    Clinical Atherosclerotic Cardiovascular Disease (ASCVD): No  The 10-year ASCVD risk score (Arnett DK, et al., 2019) is: 12.9%   Values used to calculate the score:     Age: 47 years     Sex: Female     Is Non-Hispanic African American: Yes     Diabetic: Yes     Tobacco smoker: Yes     Systolic Blood Pressure: 142 mmHg     Is BP treated: No     HDL Cholesterol: 43 mg/dL     Total Cholesterol: 174 mg/dL   A/P: Diabetes longstanding currently uncontrolled based on A1c. Patient is able to verbalize appropriate hypoglycemia management plan. Medication adherence appears to be appropriate. -Increased dose of basal insulin Lantus/(insulin glargine) from 20 units to 24 units daily in the morning.  -Adjusted metformin to metformin XR 500 mg BID.  -Extensively discussed pathophysiology of diabetes, recommended lifestyle interventions,  dietary effects on blood sugar control.  -Counseled on s/sx of and management of hypoglycemia.  -Next A1c anticipated June 2024.   Written patient instructions provided. Patient verbalized understanding of treatment plan.  Total time in face to face counseling 20 minutes.    Follow-up:  Pharmacist 1 month. PCP clinic visit in June 2024.   Georga Hacking, Pharm.D PGY1 Pharmacy Resident 05/16/2022 1:00 PM

## 2022-06-16 ENCOUNTER — Ambulatory Visit: Payer: 59 | Admitting: Pharmacist

## 2022-07-24 ENCOUNTER — Ambulatory Visit: Payer: 59 | Admitting: Internal Medicine

## 2022-08-15 ENCOUNTER — Ambulatory Visit: Payer: 59 | Admitting: Internal Medicine

## 2022-08-24 IMAGING — MG MM DIGITAL DIAGNOSTIC UNILAT*R* W/ TOMO W/ CAD
4 series · 4 of 12 positions shown · non-contrast
Comparison: Baseline screening mammogram dated 05/01/2021

CLINICAL DATA: Possible asymmetry in the outer right breast in the
craniocaudal projection of a recent baseline screening mammogram.

EXAM:
DIGITAL DIAGNOSTIC UNILATERAL RIGHT MAMMOGRAM WITH TOMOSYNTHESIS AND
CAD
TECHNIQUE: Right digital diagnostic mammography and breast tomosynthesis was
performed. The images were evaluated with computer-aided detection.

[R ML synth-2D]
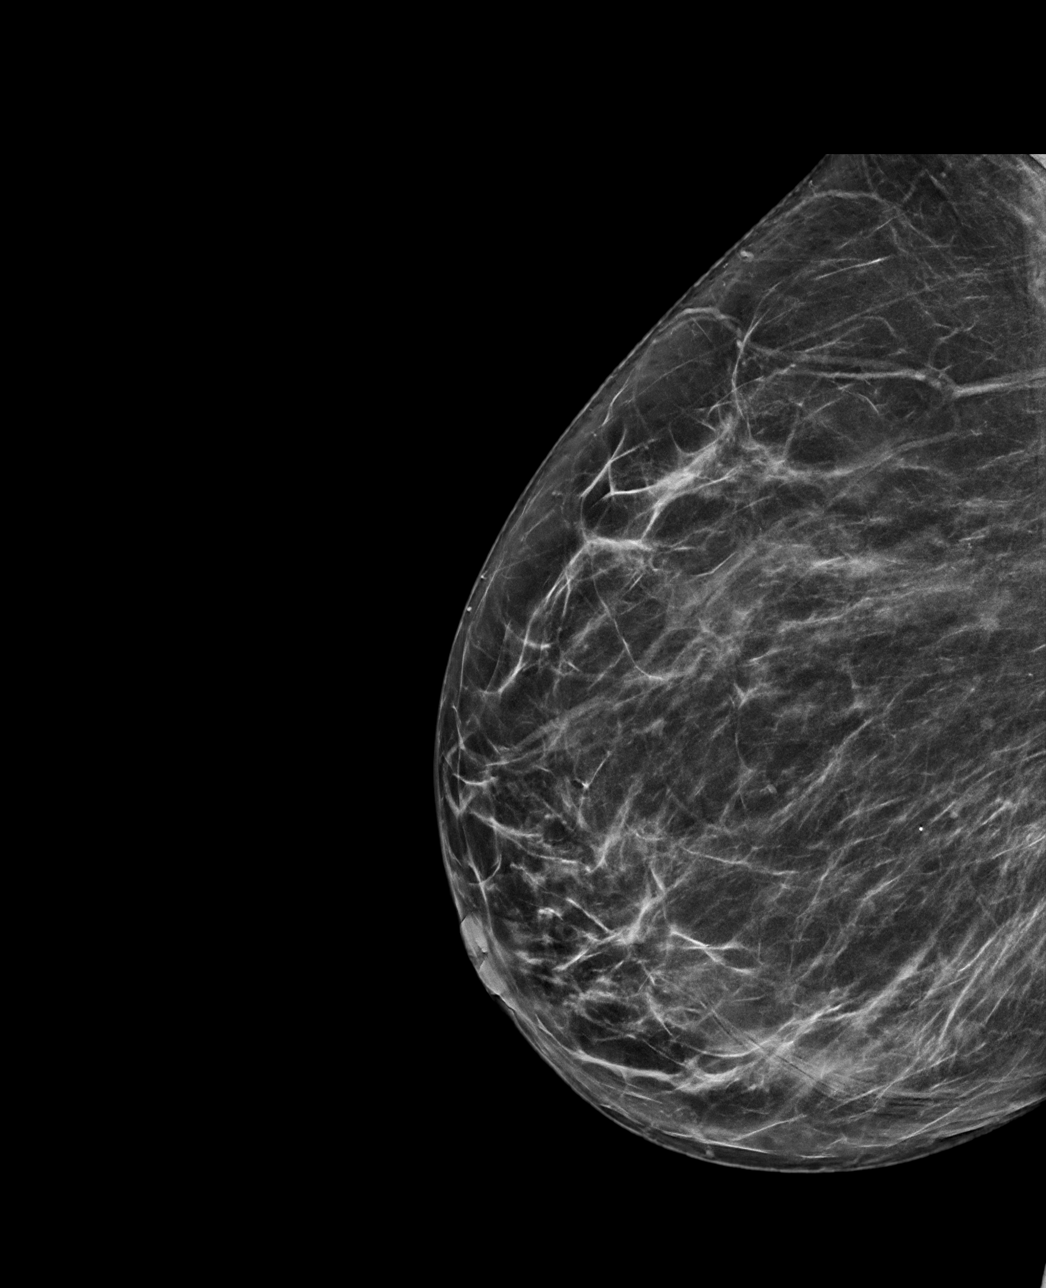

[R CC synth-2D]
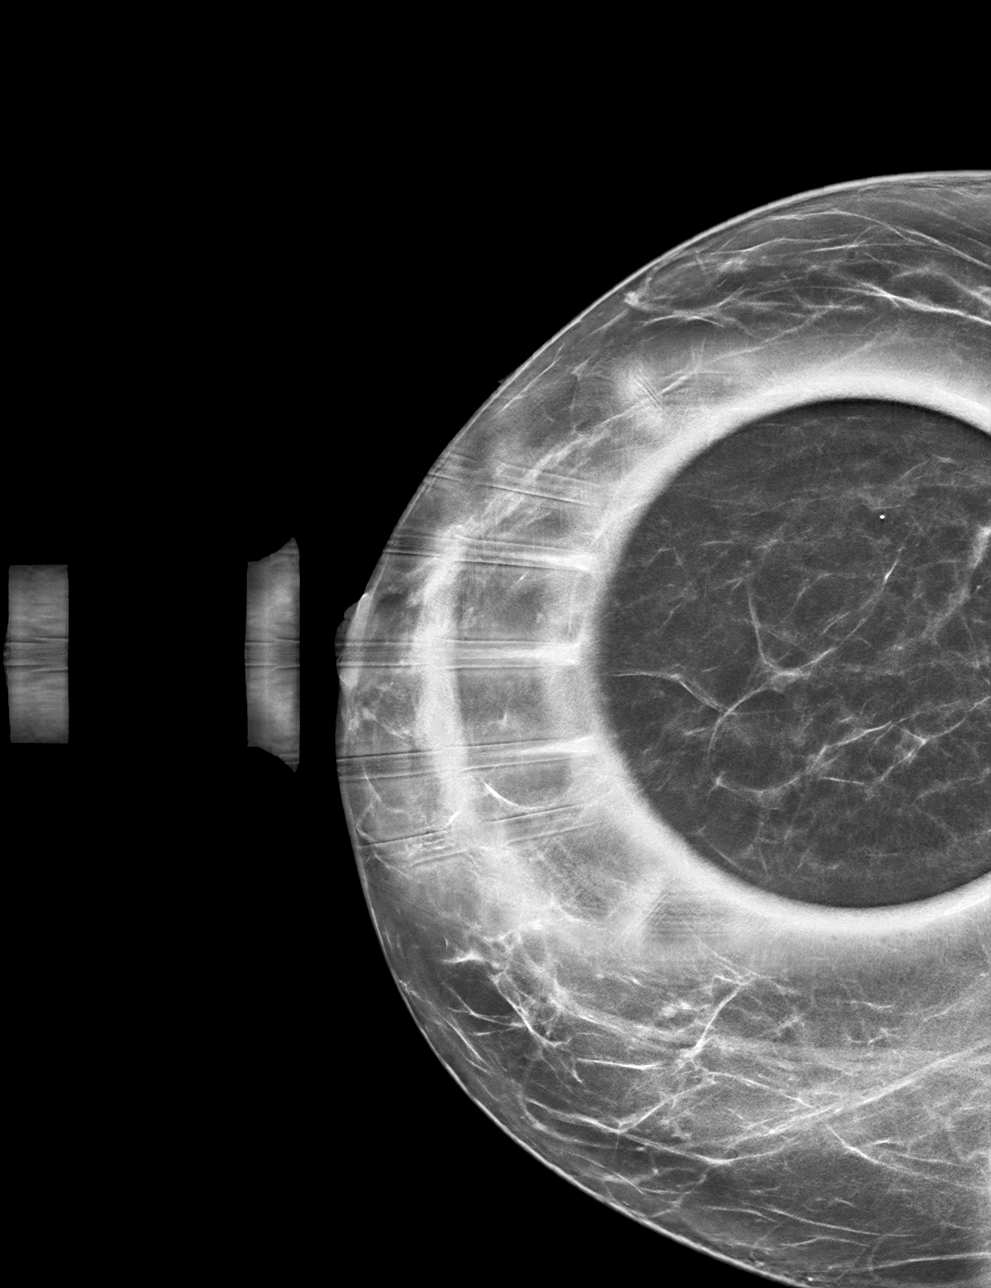

[R ML tomo · tomo slice 37/73.0]
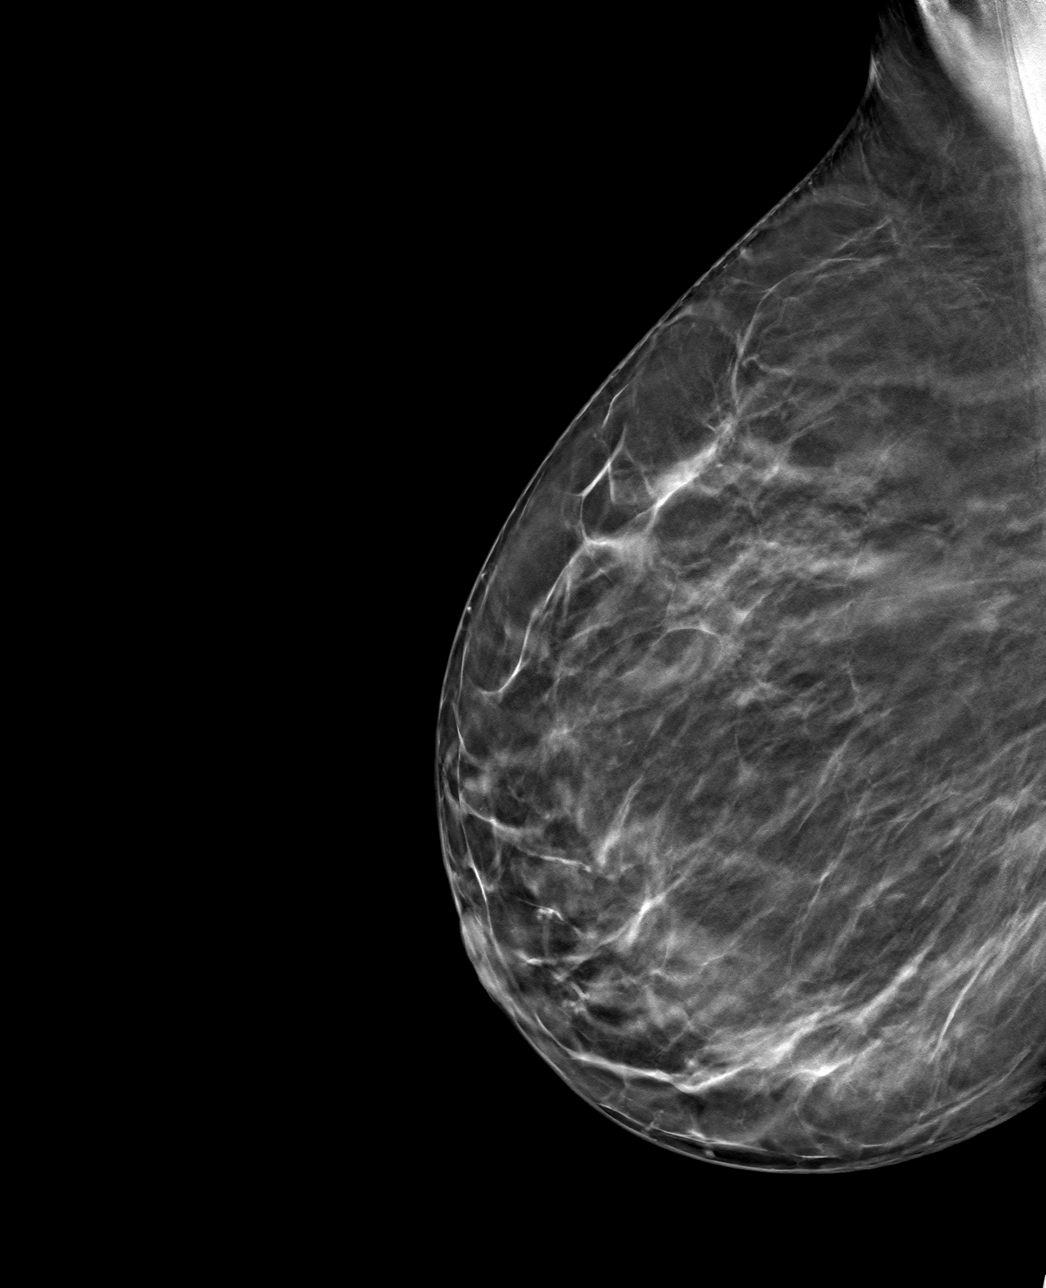

[R CC tomo · tomo slice 33/64.0]
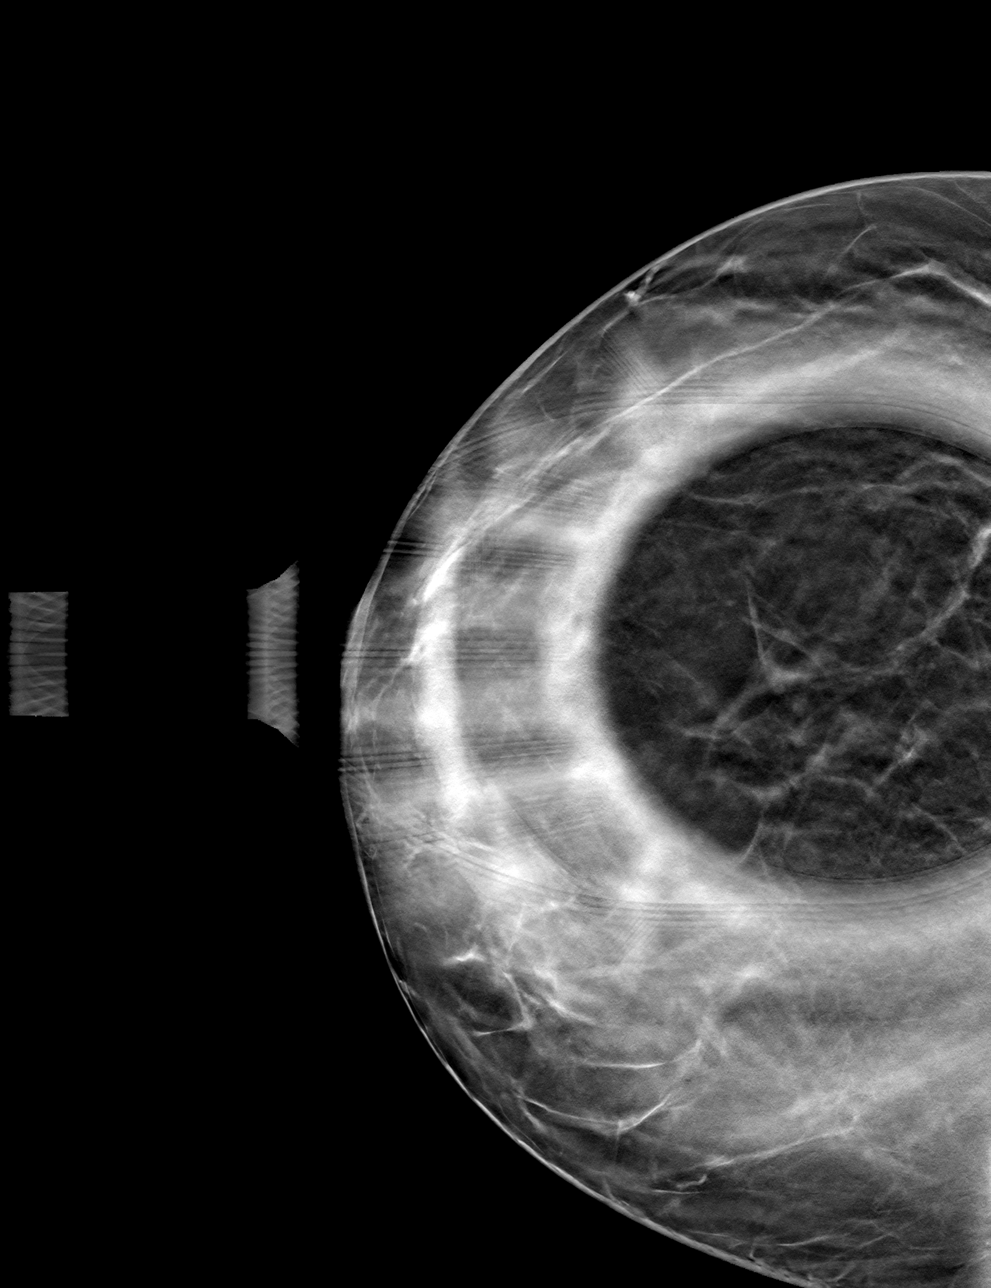

[4 of 12 positions shown; findings below may reference images not displayed]

ACR Breast Density Category b: There are scattered areas of
fibroglandular density.
FINDINGS: 3D tomographic and 2D generated true lateral and spot compression
craniocaudal images of the right breast demonstrate normal appearing
fibroglandular tissue and supporting ligaments at the location of
the recently suspected asymmetry.
IMPRESSION: No evidence of malignancy. The recently suspected right breast
asymmetry was close apposition of normal breast tissue.

RECOMMENDATION:
Bilateral screening mammogram in 1 year when due.

I have discussed the findings and recommendations with the patient.
If applicable, a reminder letter will be sent to the patient
regarding the next appointment.

BI-RADS CATEGORY  1: Negative.

## 2022-10-17 ENCOUNTER — Ambulatory Visit (HOSPITAL_COMMUNITY)
Admission: EM | Admit: 2022-10-17 | Discharge: 2022-10-17 | Disposition: A | Discharge status: Home / Self Care | Payer: 59 | Attending: Family Medicine | Admitting: Family Medicine

## 2022-10-17 ENCOUNTER — Encounter (HOSPITAL_COMMUNITY): Payer: Self-pay

## 2022-10-17 DIAGNOSIS — R1319 Other dysphagia: Secondary | ICD-10-CM

## 2022-10-17 MED ORDER — ESOMEPRAZOLE MAGNESIUM 40 MG PO CPDR
40.0000 mg | DELAYED_RELEASE_CAPSULE | Freq: Every day | ORAL | 0 refills | Status: AC
Start: 2022-10-17 — End: ?

## 2022-10-17 MED ORDER — METOCLOPRAMIDE HCL 10 MG PO TABS: ORAL_TABLET | ORAL | 0 refills | Status: AC

## 2022-10-17 NOTE — ED Provider Notes (Signed)
[facility]    CSN: [id number] Arrival date & time: 10/17/22  1710      History   Chief Complaint Chief Complaint  Patient presents with   Gastroesophageal Reflux    HPI Sherri Hall is a 47 y.o. female.    Gastroesophageal Reflux  Here for some discomfort and feeling like food is getting stuck in her mid chest.  About 2 days ago when she took a metformin pill she felt like it got stuck.  Then after that she has felt like food is going through the area slowly and getting stuck for a little bit.  She has not had to vomit up food and she has not had any choking episodes.  She has been taking Nexium 20 mg over-the-counter and Mylanta.  She does have a history of reflux.  She has been trying to make herself burp and has not been working.    Past Medical History:  Diagnosis Date   Acid reflux     Patient Active Problem List   Diagnosis Date Noted   Hyperlipidemia associated with type 2 diabetes mellitus (HCC) 03/25/2021   Diabetes mellitus type 2 in obese 02/01/2021   Elevated blood-pressure reading without diagnosis of hypertension 02/01/2021   Tobacco dependence 02/01/2021   Dental cavities 02/01/2021    Past Surgical History:  Procedure Laterality Date   CHOLECYSTECTOMY      OB History   No obstetric history on file.      Home Medications    Prior to Admission medications   Medication Sig Start Date End Date Taking? Authorizing Provider  esomeprazole (NEXIUM) 40 MG capsule Take 1 capsule (40 mg total) by mouth daily at 12 noon. 10/17/22  Yes Zenia Resides, MD  metoCLOPramide (REGLAN) 10 MG tablet Take 1 tablet 2 times daily before breakfast and supper. 10/17/22  Yes Zenia Resides, MD  atorvastatin (LIPITOR) 20 MG tablet Take 1 tablet (20 mg total) by mouth daily. 04/10/22   Anders Simmonds, PA-C  blood glucose meter kit and supplies KIT Dispense based on patient and insurance preference. Use up to four times daily as directed.  12/08/20   Rushie Chestnut, PA-C  Continuous Blood Gluc Receiver (DEXCOM G6 RECEIVER) DEVI 1 each by Does not apply route 2 (two) times daily. 04/10/22   Anders Simmonds, PA-C  Continuous Blood Gluc Sensor (DEXCOM G6 SENSOR) MISC 1 each by Does not apply route 2 (two) times daily. 04/10/22   Anders Simmonds, PA-C  fluconazole (DIFLUCAN) 150 MG tablet 1 po q week x 4 weeks 04/10/22   Anders Simmonds, PA-C  insulin glargine (LANTUS SOLOSTAR) 100 UNIT/ML Solostar Pen Inject 24 Units into the skin at bedtime. 05/16/22   Marcine Matar, MD  Insulin Pen Needle 31G X 8 MM MISC Use as directed 04/10/22   Anders Simmonds, PA-C  metFORMIN (GLUCOPHAGE-XR) 500 MG 24 hr tablet Take 1 tablet (500 mg total) by mouth 2 (two) times daily with a meal. 05/16/22   Marcine Matar, MD  nicotine (NICODERM CQ - DOSED IN MG/24 HOURS) 21 mg/24hr patch Place 1 patch (21 mg total) onto the skin daily. 02/01/21   Marcine Matar, MD  famotidine (PEPCID) 20 MG tablet Take 1 tablet (20 mg total) by mouth 2 (two) times daily. 06/23/13 01/25/19  Junious Silk, PA-C    Family History Family History  Problem Relation Age of Onset   Congestive Heart Failure Mother    Hypertension Mother  Healthy Mother    Hypertension Father    Seizures Father    Congestive Heart Failure Father    Breast cancer Neg Hx     Social History Social History   Tobacco Use   Smoking status: Every Day    Current packs/day: 0.50    Types: Cigarettes   Smokeless tobacco: Never  Vaping Use   Vaping status: Never Used  Substance Use Topics   Alcohol use: No   Drug use: No     Allergies   Patient has no known allergies.   Review of Systems Review of Systems   Physical Exam Triage Vital Signs ED Triage Vitals  Encounter Vitals Group     BP 10/17/22 1749 (!) 149/85     Systolic BP Percentile --      Diastolic BP Percentile --      Pulse Rate 10/17/22 1749 90     Resp 10/17/22 1749 18     Temp 10/17/22 1749 98.1  F (36.7 C)     Temp Source 10/17/22 1749 Oral     SpO2 10/17/22 1749 97 %     Weight --      Height --      Head Circumference --      Peak Flow --      Pain Score 10/17/22 1751 0     Pain Loc --      Pain Education --      Exclude from Growth Chart --    No data found.  Updated Vital Signs BP (!) 149/85 (BP Location: Left Arm)   Pulse 90   Temp 98.1 F (36.7 C) (Oral)   Resp 18   LMP 10/13/2022   SpO2 97%   Visual Acuity Right Eye Distance:   Left Eye Distance:   Bilateral Distance:    Right Eye Near:   Left Eye Near:    Bilateral Near:     Physical Exam Vitals reviewed.  Constitutional:      General: She is not in acute distress.    Appearance: She is not ill-appearing, toxic-appearing or diaphoretic.     Comments: Comfortable in the room and no drooling.  HENT:     Mouth/Throat:     Mouth: Mucous membranes are moist.  Cardiovascular:     Rate and Rhythm: Normal rate and regular rhythm.     Heart sounds: No murmur heard. Pulmonary:     Effort: Pulmonary effort is normal.     Breath sounds: Normal breath sounds. No stridor. No wheezing.  Chest:     Chest wall: No tenderness.  Abdominal:     Palpations: Abdomen is soft.     Tenderness: There is no abdominal tenderness.  Skin:    Coloration: Skin is not pale.  Neurological:     Mental Status: She is alert and oriented to person, place, and time.  Psychiatric:        Behavior: Behavior normal.      UC Treatments / Results  Labs (all labs ordered are listed, but only abnormal results are displayed) Labs Reviewed - No data to display  EKG   Radiology No results found.  Procedures Procedures (including critical care time)  Medications Ordered in UC Medications - No data to display  Initial Impression / Assessment and Plan / UC Course  I have reviewed the triage vital signs and the nursing notes.  Pertinent labs & imaging results that were available during my care of the patient were  reviewed  by me and considered in my medical decision making (see chart for details).        I am going to send in an increased dose of Nexium and also of Reglan to see if that would help her symptoms any.  She will continue the Mylanta.  She will contact her primary care first thing Monday morning when they are open.  Discussed with her that she may need a scope to sort out what is going on. Final Clinical Impressions(s) / UC Diagnoses   Final diagnoses:  Esophageal dysphagia     Discharge Instructions      Esomeprazole/Nexium 40 mg--1 daily for reflux.  Metoclopramide 10 mg--take 1 tablet 2 times daily before breakfast and supper.  This is to try to help stomach acid and fluids move down.  Continue the liquid antacid as needed.  Please call your primary care first thing when they are open on Monday     ED Prescriptions     Medication Sig Dispense Auth. Provider   esomeprazole (NEXIUM) 40 MG capsule Take 1 capsule (40 mg total) by mouth daily at 12 noon. 30 capsule Zenia Resides, MD   metoCLOPramide (REGLAN) 10 MG tablet Take 1 tablet 2 times daily before breakfast and supper. 30 tablet Shantinique Picazo, Janace Aris, MD      PDMP not reviewed this encounter.   Zenia Resides, MD 10/17/22 [phone]

## 2022-10-17 NOTE — ED Triage Notes (Signed)
Pt states has been having problems with her acid reflux and 2 days she took her metformin and felt like it got stuck and since then feel like her food is stuck. States malanta and Nexium with no relief.

## 2022-10-17 NOTE — Discharge Instructions (Signed)
Esomeprazole/Nexium 40 mg--1 daily for reflux.  Metoclopramide 10 mg--take 1 tablet 2 times daily before breakfast and supper.  This is to try to help stomach acid and fluids move down.  Continue the liquid antacid as needed.  Please call your primary care first thing when they are open on Monday

## 2022-10-21 ENCOUNTER — Ambulatory Visit: Payer: Self-pay

## 2022-10-21 ENCOUNTER — Telehealth: Payer: 59 | Admitting: Physician Assistant

## 2022-10-21 DIAGNOSIS — T7840XA Allergy, unspecified, initial encounter: Secondary | ICD-10-CM

## 2022-10-21 MED ORDER — HYDROXYZINE PAMOATE 25 MG PO CAPS: 25.0000 mg | ORAL_CAPSULE | Freq: Three times a day (TID) | ORAL | 0 refills | Status: AC | PRN

## 2022-10-21 NOTE — Telephone Encounter (Signed)
Noted  

## 2022-10-21 NOTE — Progress Notes (Signed)
Virtual Visit Consent   Sherri Hall, you are scheduled for a virtual visit with a [facility] provider today. Just as with appointments in the office, your consent must be obtained to participate. Your consent will be active for this visit and any virtual visit you may have with one of our providers in the next 365 days. If you have a MyChart account, a copy of this consent can be sent to you electronically.  As this is a virtual visit, video technology does not allow for your provider to perform a traditional examination. This may limit your provider's ability to fully assess your condition. If your provider identifies any concerns that need to be evaluated in person or the need to arrange testing (such as labs, EKG, etc.), we will make arrangements to do so. Although advances in technology are sophisticated, we cannot ensure that it will always work on either your end or our end. If the connection with a video visit is poor, the visit may have to be switched to a telephone visit. With either a video or telephone visit, we are not always able to ensure that we have a secure connection.  By engaging in this virtual visit, you consent to the provision of healthcare and authorize for your insurance to be billed (if applicable) for the services provided during this visit. Depending on your insurance coverage, you may receive a charge related to this service.  I need to obtain your verbal consent now. Are you willing to proceed with your visit today? Sherri Hall has provided verbal consent on 10/21/2022 for a virtual visit (video or telephone). Piedad Climes, [place]  Date: 10/21/2022 12:51 PM  Virtual Visit via Video Note   I, Piedad Climes, connected with  Sherri Hall  ([id number], 07/11/1975) on 10/21/22 at 12:30 PM EDT by a video-enabled telemedicine application and verified that I am speaking with the correct person using two identifiers.  Location: Patient: Virtual  Visit Location Patient: Home Provider: Virtual Visit Location Provider: Home Office   I discussed the limitations of evaluation and management by telemedicine and the availability of in person appointments. The patient expressed understanding and agreed to proceed.    History of Present Illness: Sherri Hall is a 47 y.o. who identifies as a female who was assigned female at birth, and is being seen today for pruritus over the past week. Endorses wasp nest at the back of her door and getting into her laundry room. Stung on her hand first time, with local swelling. Benadryl resolved. Got stung again on stomach 2 days later with more generalized itching. Denies changes to soaps, lotions or detergents. Denies rash just substantial pruritus. Denies fever, chills, SOB. OTC -- Benadryl, cortisone cream -- mildly beneficial.   HPI: HPI  Problems:  Patient Active Problem List   Diagnosis Date Noted   Hyperlipidemia associated with type 2 diabetes mellitus (HCC) 03/25/2021   Diabetes mellitus type 2 in obese 02/01/2021   Elevated blood-pressure reading without diagnosis of hypertension 02/01/2021   Tobacco dependence 02/01/2021   Dental cavities 02/01/2021    Allergies: No Known Allergies Medications:  Current Outpatient Medications:    hydrOXYzine (VISTARIL) 25 MG capsule, Take 1 capsule (25 mg total) by mouth every 8 (eight) hours as needed., Disp: 30 capsule, Rfl: 0   atorvastatin (LIPITOR) 20 MG tablet, Take 1 tablet (20 mg total) by mouth daily., Disp: 30 tablet, Rfl: 3   blood glucose meter kit and supplies KIT, Dispense  based on patient and insurance preference. Use up to four times daily as directed., Disp: 1 each, Rfl: 0   Continuous Blood Gluc Receiver (DEXCOM G6 RECEIVER) DEVI, 1 each by Does not apply route 2 (two) times daily., Disp: 1 each, Rfl: 0   Continuous Blood Gluc Sensor (DEXCOM G6 SENSOR) MISC, 1 each by Does not apply route 2 (two) times daily., Disp: 3 each, Rfl: 10    esomeprazole (NEXIUM) 40 MG capsule, Take 1 capsule (40 mg total) by mouth daily at 12 noon., Disp: 30 capsule, Rfl: 0   insulin glargine (LANTUS SOLOSTAR) 100 UNIT/ML Solostar Pen, Inject 24 Units into the skin at bedtime., Disp: 15 mL, Rfl: 3   Insulin Pen Needle 31G X 8 MM MISC, Use as directed, Disp: 100 each, Rfl: 6   metFORMIN (GLUCOPHAGE-XR) 500 MG 24 hr tablet, Take 1 tablet (500 mg total) by mouth 2 (two) times daily with a meal., Disp: 180 tablet, Rfl: 0   metoCLOPramide (REGLAN) 10 MG tablet, Take 1 tablet 2 times daily before breakfast and supper., Disp: 30 tablet, Rfl: 0  Observations/Objective: Patient is well-developed, well-nourished in no acute distress.  Resting comfortably  at home.  Head is normocephalic, atraumatic.  No labored breathing.  Speech is clear and coherent with logical content.  Patient is alert and oriented at baseline.  No noted rash  Assessment and Plan: 1. Allergic reaction, initial encounter - hydrOXYzine (VISTARIL) 25 MG capsule; Take 1 capsule (25 mg total) by mouth every 8 (eight) hours as needed.  Dispense: 30 capsule; Refill: 0  With pruritus, after two back to back episodes of wasp stings. Start hydroxyzine (need to avoid prednisone due to uncontrolled diabetes). Supportive measures and OTC medications reviewed. She is to call and schedule PCP follow-up.  Follow Up Instructions: I discussed the assessment and treatment plan with the patient. The patient was provided an opportunity to ask questions and all were answered. The patient agreed with the plan and demonstrated an understanding of the instructions.  A copy of instructions were sent to the patient via MyChart unless otherwise noted below.   The patient was advised to call back or seek an in-person evaluation if the symptoms worsen or if the condition fails to improve as anticipated.  Time:  I spent 10 minutes with the patient via telehealth technology discussing the above  problems/concerns.    Piedad Climes, PA-C

## 2022-10-21 NOTE — Patient Instructions (Signed)
  Sherri Hall, thank you for joining Piedad Climes, PA-C for today's virtual visit.  While this provider is not your primary care provider (PCP), if your PCP is located in our provider database this encounter information will be shared with them immediately following your visit.   A [facility] MyChart account gives you access to today's visit and all your visits, tests, and labs performed at [facility] " click here if you don't have a [facility] MyChart account or go to [email or web address]  Consent: (Patient) Sherri Hall provided verbal consent for this virtual visit at the beginning of the encounter.  Current Medications:  Current Outpatient Medications:    atorvastatin (LIPITOR) 20 MG tablet, Take 1 tablet (20 mg total) by mouth daily., Disp: 30 tablet, Rfl: 3   blood glucose meter kit and supplies KIT, Dispense based on patient and insurance preference. Use up to four times daily as directed., Disp: 1 each, Rfl: 0   Continuous Blood Gluc Receiver (DEXCOM G6 RECEIVER) DEVI, 1 each by Does not apply route 2 (two) times daily., Disp: 1 each, Rfl: 0   Continuous Blood Gluc Sensor (DEXCOM G6 SENSOR) MISC, 1 each by Does not apply route 2 (two) times daily., Disp: 3 each, Rfl: 10   esomeprazole (NEXIUM) 40 MG capsule, Take 1 capsule (40 mg total) by mouth daily at 12 noon., Disp: 30 capsule, Rfl: 0   insulin glargine (LANTUS SOLOSTAR) 100 UNIT/ML Solostar Pen, Inject 24 Units into the skin at bedtime., Disp: 15 mL, Rfl: 3   Insulin Pen Needle 31G X 8 MM MISC, Use as directed, Disp: 100 each, Rfl: 6   metFORMIN (GLUCOPHAGE-XR) 500 MG 24 hr tablet, Take 1 tablet (500 mg total) by mouth 2 (two) times daily with a meal., Disp: 180 tablet, Rfl: 0   metoCLOPramide (REGLAN) 10 MG tablet, Take 1 tablet 2 times daily before breakfast and supper., Disp: 30 tablet, Rfl: 0   Medications ordered in this encounter:  No orders of the defined types were placed in this  encounter.    *If you need refills on other medications prior to your next appointment, please contact your pharmacy*  Follow-Up: Call back or seek an in-person evaluation if the symptoms worsen or if the condition fails to improve as anticipated.  [facility] [phone]  Other Instructions Please keep skin clean and dry. Start OTC Famotidine 20 mg twice daily. Take the hydroxyzine as directed. Symptoms should improve substantially within 48 hours and continue to improve until resolved. If not or anything new/worsening, you need an in-person assessment ASAP.   If you have been instructed to have an in-person evaluation today at a local Urgent Care facility, please use the link below. It will take you to a list of all of our available [facility], including address, phone number and hours of operation. Please do not delay care.  [facility]  If you or a family member do not have a primary care provider, use the link below to schedule a visit and establish care. When you choose a [facility] primary care physician or advanced practice provider, you gain a long-term partner in health. Find a Primary Care Provider  Learn more about [facility] in-office and virtual care options: [facility] - Get Care Now

## 2022-10-21 NOTE — Telephone Encounter (Signed)
  Chief Complaint: Itching - head, legs feet - no rash Symptoms: above Frequency: 1 week Pertinent Negatives: Patient denies rash Disposition: [] ED /[] Urgent Care (no appt availability in office) / [] Appointment(In office/virtual)/ [x]  [facility]/ [] Home Care/ [] Refused Recommended Disposition /[] [facility]/ []  Follow-up with PCP Additional Notes: Pt states that she has had itching for the past week on and off. She takes benadryl and itching resolves, but returns. Pt was stung by wasps about 1 week ago. No appts in office  - [facility] scheduled.  Reason for Disposition  [1] MODERATE-SEVERE widespread itching (i.e., interferes with sleep, normal activities or school) AND [2] not improved after 24 hours of itching Care Advice  Answer Assessment - Initial Assessment Questions 1. DESCRIPTION: "Describe the itching you are having."     Head legs and feet 2. SEVERITY: "How bad is it?"    - MILD: Doesn't interfere with normal activities.   - MODERATE-SEVERE: Interferes with work, school, sleep, or other activities.      Mild - moderate 3. SCRATCHING: "Are there any scratch marks? Bleeding?"     no 4. ONSET: "When did this begin?"      1 week off and on 5. CAUSE: "What do you think is causing the itching?" (ask about swimming pools, pollen, animals, soaps, etc.)     allergic 6. OTHER SYMPTOMS: "Do you have any other symptoms?"      no  Protocols used: Itching - [facility]

## 2023-03-11 ENCOUNTER — Other Ambulatory Visit: Payer: Self-pay | Admitting: Physician Assistant

## 2023-03-11 ENCOUNTER — Other Ambulatory Visit: Payer: Self-pay | Admitting: Internal Medicine

## 2023-03-11 DIAGNOSIS — E1169 Type 2 diabetes mellitus with other specified complication: Secondary | ICD-10-CM

## 2023-03-11 DIAGNOSIS — E1165 Type 2 diabetes mellitus with hyperglycemia: Secondary | ICD-10-CM

## 2023-09-14 ENCOUNTER — Telehealth: Payer: Self-pay | Admitting: Internal Medicine

## 2023-09-14 ENCOUNTER — Other Ambulatory Visit: Payer: Self-pay | Admitting: Internal Medicine

## 2023-09-14 DIAGNOSIS — E1165 Type 2 diabetes mellitus with hyperglycemia: Secondary | ICD-10-CM

## 2023-09-14 NOTE — Telephone Encounter (Signed)
 Confirmed appt for 8/19

## 2023-09-15 ENCOUNTER — Ambulatory Visit: Admitting: Internal Medicine

## 2023-09-16 ENCOUNTER — Telehealth: Payer: Self-pay | Admitting: Internal Medicine

## 2023-09-16 NOTE — Telephone Encounter (Addendum)
 Doris Agricultural consultant confirmed appt for 8/21

## 2023-09-17 ENCOUNTER — Ambulatory Visit: Attending: Internal Medicine | Admitting: Internal Medicine

## 2023-09-17 ENCOUNTER — Encounter: Payer: Self-pay | Admitting: Internal Medicine

## 2023-09-17 VITALS — BP 156/87 | HR 82 | Resp 19 | Ht 63.0 in | Wt 191.6 lb

## 2023-09-17 DIAGNOSIS — E1169 Type 2 diabetes mellitus with other specified complication: Secondary | ICD-10-CM

## 2023-09-17 DIAGNOSIS — F17210 Nicotine dependence, cigarettes, uncomplicated: Secondary | ICD-10-CM

## 2023-09-17 DIAGNOSIS — H9191 Unspecified hearing loss, right ear: Secondary | ICD-10-CM

## 2023-09-17 DIAGNOSIS — E1159 Type 2 diabetes mellitus with other circulatory complications: Secondary | ICD-10-CM | POA: Diagnosis not present

## 2023-09-17 DIAGNOSIS — Z6833 Body mass index (BMI) 33.0-33.9, adult: Secondary | ICD-10-CM

## 2023-09-17 DIAGNOSIS — Z794 Long term (current) use of insulin: Secondary | ICD-10-CM

## 2023-09-17 DIAGNOSIS — I152 Hypertension secondary to endocrine disorders: Secondary | ICD-10-CM | POA: Diagnosis not present

## 2023-09-17 DIAGNOSIS — E669 Obesity, unspecified: Secondary | ICD-10-CM | POA: Diagnosis not present

## 2023-09-17 DIAGNOSIS — E785 Hyperlipidemia, unspecified: Secondary | ICD-10-CM

## 2023-09-17 DIAGNOSIS — Z1211 Encounter for screening for malignant neoplasm of colon: Secondary | ICD-10-CM

## 2023-09-17 LAB — POCT GLYCOSYLATED HEMOGLOBIN (HGB A1C): Hemoglobin A1C: 7.5 % — AB (ref 4.0–5.6)

## 2023-09-17 MED ORDER — OZEMPIC (0.25 OR 0.5 MG/DOSE) 2 MG/3ML ~~LOC~~ SOPN: 0.2500 mg | PEN_INJECTOR | SUBCUTANEOUS | 0 refills | Status: AC

## 2023-09-17 MED ORDER — DEXCOM G7 RECEIVER DEVI: 12 refills | Status: AC

## 2023-09-17 MED ORDER — VALSARTAN 40 MG PO TABS: 40.0000 mg | ORAL_TABLET | Freq: Every day | ORAL | 3 refills | Status: AC

## 2023-09-17 MED ORDER — INSULIN PEN NEEDLE 31G X 8 MM MISC: 6 refills | Status: AC

## 2023-09-17 MED ORDER — LANTUS SOLOSTAR 100 UNIT/ML ~~LOC~~ SOPN: 12.0000 [IU] | PEN_INJECTOR | Freq: Every day | SUBCUTANEOUS | 3 refills | Status: AC

## 2023-09-17 MED ORDER — ATORVASTATIN CALCIUM 20 MG PO TABS: 20.0000 mg | ORAL_TABLET | Freq: Every day | ORAL | 0 refills | Status: AC

## 2023-09-17 MED ORDER — HYDROCHLOROTHIAZIDE 12.5 MG PO TABS: 12.5000 mg | ORAL_TABLET | Freq: Every day | ORAL | 3 refills | Status: AC

## 2023-09-17 NOTE — Patient Instructions (Signed)
 VISIT SUMMARY:  Today, you came in for a follow-up visit and medication refills. We discussed your diabetes management, blood pressure, cholesterol, and hearing concerns. You have made some positive lifestyle changes, and we have adjusted your treatment plan to better manage your conditions.  YOUR PLAN:  -TYPE 2 DIABETES MELLITUS: Type 2 diabetes is a condition where your body does not use insulin  properly, leading to high blood sugar levels. Your A1c is currently 7.5, which is above the target of less than 7. We will start you on Ozempic , a medication that helps control blood sugar, and will work on Clinical biochemist. Continue taking Lantus  insulin  at 12 units daily until Ozempic  becomes effective. We will also prescribe the Dexcom G7 for continuous glucose monitoring. Keep up with your dietary changes and try to increase your physical activity to three times a week for 30 minutes.  -HYPERTENSION: Hypertension, or high blood pressure, can lead to serious health issues if not managed properly. Your blood pressure today was 156/87 mmHg. We will start you on valsartan  and hydrochlorothiazide  to help control your blood pressure. Please reduce your salt intake, including switching from salted sunflower seeds to unsalted snacks. We will also do some baseline blood tests to check your kidney and liver function. Return for these blood tests and a urine protein check.  -HYPERLIPIDEMIA: Hyperlipidemia is a condition where you have high levels of cholesterol in your blood. You are currently managing this with atorvastatin  20 mg daily. Continue taking this medication as prescribed.  -HEARING LOSS, RIGHT EAR: You have some hearing loss in your right ear, possibly due to earwax buildup. We will refer you to an ENT specialist for further evaluation and management.  INSTRUCTIONS:  We will start the process for insurance approval for Ozempic . Please continue taking your current medications and follow the  new prescriptions as discussed. Return for blood tests and a urine protein check. Follow up with the ENT specialist for your hearing concerns.

## 2023-09-17 NOTE — Progress Notes (Signed)
 Patient ID: Sherri Hall, female    DOB: 1975/10/05  MRN: [id number]  CC: Medication Refill   Subjective: Sherri Hall is a 48 y.o. female who presents for chronic ds management. Her concerns today include:  Patient with history of DM type II, tobacco dependence, HL, elevated BP   Discussed the use of AI scribe software for clinical note transcription with the patient, who gave verbal consent to proceed.  History of Present Illness Sherri Hall is a 48 year old female with diabetes who presents for medication refills and follow-up.  DM: Results for orders placed or performed in visit on 09/17/23  POCT glycosylated hemoglobin (Hb A1C)   Collection Time: 09/17/23  4:42 PM  Result Value Ref Range   Hemoglobin A1C 7.5 (A) 4.0 - 5.6 %   HbA1c POC (<> result, manual entry)     HbA1c, POC (prediabetic range)     HbA1c, POC (controlled diabetic range)    Her A1c is currently 7.5, with a goal of less than 7. She was previously on metformin  but discontinued it two months ago due to gastrointestinal side effects, including diarrhea and stomach upset. She is currently taking Lantus  insulin , but only 12 units daily instead of the prescribed 24 units due to supply issues. She checks her blood sugar every night, with readings between 120 and 150 mg/dL, and occasionally in the morning with similar results. She inquires about obtaining a Dexcom 7 for continuous glucose monitoring, having previously used the Dexcom 6. She also wants to discontinue insulin  and has previously attempted to obtain Ozempic  or Trulicity  in 2023, but found them unaffordable due to insurance coverage issues.  She has made dietary changes, reducing soda intake from 3-4 sodas a day to one soda a day, and has stopped using sugar in her coffee. She snacks on sunflower seeds and eats fruits regularly. She exercises once a week with her daughter at the gym.  BP elevated today and was elevated on last visit 4 mths  ago.   She wakes up with headaches twice this week.   HL: She is currently taking atorvastatin  20 mg daily for cholesterol management.  She has a history of earwax buildup and decreased hearing in her right ear, for which she was previously referred to an ENT specialist but missed the appointment. Request RF.  HM: She has no family history of colon cancer and prefers to use Cologuard for colon cancer screening. Due for DM eye exam    Patient Active Problem List   Diagnosis Date Noted   Hyperlipidemia associated with type 2 diabetes mellitus (HCC) 03/25/2021   Type 2 diabetes mellitus with obesity (HCC) 02/01/2021   Elevated blood-pressure reading without diagnosis of hypertension 02/01/2021   Tobacco dependence 02/01/2021   Dental cavities 02/01/2021     Current Outpatient Medications on File Prior to Visit  Medication Sig Dispense Refill   blood glucose meter kit and supplies KIT Dispense based on patient and insurance preference. Use up to four times daily as directed. 1 each 0   Continuous Blood Gluc Sensor (DEXCOM G6 SENSOR) MISC 1 each by Does not apply route 2 (two) times daily. 3 each 10   esomeprazole  (NEXIUM ) 40 MG capsule Take 1 capsule (40 mg total) by mouth daily at 12 noon. 30 capsule 0   hydrOXYzine  (VISTARIL ) 25 MG capsule Take 1 capsule (25 mg total) by mouth every 8 (eight) hours as needed. 30 capsule 0   metoCLOPramide  (REGLAN ) 10 MG tablet  Take 1 tablet 2 times daily before breakfast and supper. 30 tablet 0   [DISCONTINUED] famotidine  (PEPCID ) 20 MG tablet Take 1 tablet (20 mg total) by mouth 2 (two) times daily. 30 tablet 0   No current facility-administered medications on file prior to visit.    Allergies  Allergen Reactions   Metformin  And Related Diarrhea    GI upset and diarrhea    Social History   Socioeconomic History   Marital status: Single    Spouse name: Not on file   Number of children: 5   Years of education: Not on file   Highest education  level: GED or equivalent  Occupational History   Occupation: CNA  Tobacco Use   Smoking status: Every Day    Current packs/day: 0.50    Types: Cigarettes   Smokeless tobacco: Never  Vaping Use   Vaping status: Never Used  Substance and Sexual Activity   Alcohol use: No   Drug use: No   Sexual activity: Not Currently  Other Topics Concern   Not on file  Social History Narrative   Not on file   Social Drivers of Health   Financial Resource Strain: Low Risk  (05/16/2022)   Overall Financial Resource Strain (CARDIA)    Difficulty of Paying Living Expenses: Not very hard  Food Insecurity: No Food Insecurity (05/16/2022)   Hunger Vital Sign    Worried About Running Out of Food in the Last Year: Never true    Ran Out of Food in the Last Year: Never true  Transportation Needs: No Transportation Needs (05/16/2022)   PRAPARE - Administrator, Civil Service (Medical): No    Lack of Transportation (Non-Medical): No  Physical Activity: Inactive (05/16/2022)   Exercise Vital Sign    Days of Exercise per Week: 0 days    Minutes of Exercise per Session: 0 min  Stress: No Stress Concern Present (05/16/2022)   [employer] of Occupational Health - Occupational Stress Questionnaire    Feeling of Stress : Only a little  Social Connections: Moderately Isolated (05/16/2022)   Social Connection and Isolation Panel    Frequency of Communication with Friends and Family: More than three times a week    Frequency of Social Gatherings with Friends and Family: More than three times a week    Attends Religious Services: More than 4 times per year    Active Member of [employer] or Organizations: No    Attends Banker Meetings: Never    Marital Status: Never married  Intimate Partner Violence: Not At Risk (05/16/2022)   Humiliation, Afraid, Rape, and Kick questionnaire    Fear of Current or Ex-Partner: No    Emotionally Abused: No    Physically Abused: No    Sexually Abused: No     Family History  Problem Relation Age of Onset   Congestive Heart Failure Mother    Hypertension Mother    Healthy Mother    Hypertension Father    Seizures Father    Congestive Heart Failure Father    Breast cancer Neg Hx     Past Surgical History:  Procedure Laterality Date   CHOLECYSTECTOMY      ROS: Review of Systems Negative except as stated above  PHYSICAL EXAM: BP (!) 156/87   Pulse 82   Resp 19   Ht 5' 3 (1.6 m)   Wt 191 lb 9.6 oz (86.9 kg)   SpO2 100%   BMI 33.94 kg/m   Wt  Readings from Last 3 Encounters:  09/17/23 191 lb 9.6 oz (86.9 kg)  04/10/22 177 lb 9.6 oz (80.6 kg)  04/29/21 195 lb 6.4 oz (88.6 kg)    Physical Exam  General appearance - alert, well appearing, and in no distress Mental status - normal mood, behavior, speech, dress, motor activity, and thought processes Ears -left ear: Canal and membrane within normal limit.  Right ear: Small amount of wax buildup near the opening but canal and membrane within normal limits. Neck - supple, no significant adenopathy Chest - clear to auscultation, no wheezes, rales or rhonchi, symmetric air entry Heart - normal rate, regular rhythm, normal S1, S2, no murmurs, rubs, clicks or gallops Extremities - peripheral pulses normal, no pedal edema, no clubbing or cyanosis Diabetic Foot Exam - Simple   Simple Foot Form Diabetic Foot exam was performed with the following findings: Yes 09/17/2023  5:35 PM  Visual Inspection No deformities, no ulcerations, no other skin breakdown bilaterally: Yes Sensation Testing Intact to touch and monofilament testing bilaterally: Yes Pulse Check Posterior Tibialis and Dorsalis pulse intact bilaterally: Yes Comments         Latest Ref Rng & Units 02/01/2021   10:04 AM 02/18/2018    1:53 PM 10/22/2015    5:06 PM  CMP  Glucose 70 - 99 mg/dL 811  884  883   BUN 6 - 24 mg/dL 10  8  8    Creatinine 0.57 - 1.00 mg/dL 9.24  9.06  9.22   Sodium 134 - 144 mmol/L 139  138  138    Potassium 3.5 - 5.2 mmol/L 4.5  4.0  4.0   Chloride 96 - 106 mmol/L 102  107  108   CO2 20 - 29 mmol/L 23  23  23    Calcium  8.7 - 10.2 mg/dL 9.7  9.5  9.0   Total Protein 6.0 - 8.5 g/dL 7.2   6.5   Total Bilirubin 0.0 - 1.2 mg/dL 0.3   0.6   Alkaline Phos 44 - 121 IU/L 89   62   AST 0 - 40 IU/L 16   19   ALT 0 - 32 IU/L 13   12    Lipid Panel     Component Value Date/Time   CHOL 174 02/01/2021 1004   TRIG 89 02/01/2021 1004   HDL 43 02/01/2021 1004   CHOLHDL 4.0 02/01/2021 1004   LDLCALC 114 (H) 02/01/2021 1004    CBC    Component Value Date/Time   WBC 6.4 02/01/2021 1004   WBC 7.7 02/18/2018 1353   RBC 4.92 02/01/2021 1004   RBC 4.79 02/18/2018 1353   HGB 13.9 02/01/2021 1004   HCT 41.8 02/01/2021 1004   PLT 443 02/01/2021 1004   MCV 85 02/01/2021 1004   MCH 28.3 02/01/2021 1004   MCH 27.6 02/18/2018 1353   MCHC 33.3 02/01/2021 1004   MCHC 31.8 02/18/2018 1353   RDW 13.3 02/01/2021 1004   LYMPHSABS 1.1 11/01/2009 1848   MONOABS 1.1 (H) 11/01/2009 1848   EOSABS 0.4 11/01/2009 1848   BASOSABS 0.0 11/01/2009 1848    ASSESSMENT AND PLAN:  Assessment and Plan Assessment & Plan Type 2 diabetes mellitus with Obesity Insulin  Use in Diabetes A1c close to goal. Commended her on dietary changes.  Encouraged her to keep up the good work.  Discussed incorporating fresh fruits and veggies into the diet daily.  Set a goal to increase her exercise from once a week to 3 times a week  for 30 minutes moderate intensity exercise. Patient wanting to get off insulin .  I think trying her with a GLP-1 agonist.  It is worth a try to see if her insurance would cover it.  Discussed putting her on Ozempic .  I went over with pt how the medication works and potential side effects including nausea, vomiting, diarrhea/constipation, bowel blockage, palpitations and pancreatitis.  Advised to stop the medicine and be seen if pt develops any abdominal pain, vomiting, severe diarrhea/constipation or  palpitations. -start Ozempic  0.25 mg once a wk. once she has been on this dose for 1 month, she should contact me to let me know that she is tolerating it so that we can send a refill for the 0.5 mg dose.  Advised to have pharmacist show her how to administer once it is dispensed.  We will have to go through a prior approval process with her insurance. -Continue Lantus  insulin  12 units. - Prescribe Dexcom G7 for continuous glucose monitoring. - Referral submitted for diabetic eye exam  Hypertension associated with type 2 diabetes -DASH diet discussed and encouraged.  Stop eating sunflower seeds that are salty.  Recommend eating fruits or unsalted nuts snack. Start valsartan  40 mg daily and HCTZ 12.5 mg daily. - Order baseline blood tests for kidney and liver function.  Lab is closed already for the evening.  Advised to return to the lab tomorrow or next week to have these blood test done.  Hyperlipidemia associated with type 2 diabetes Managed with atorvastatin  20 mg daily.  Hearing loss, right ear - Refer to ENT for further evaluation and management.  Colon cancer screening Patient agreeable to Cologuard testing which was ordered.   Patient was given the opportunity to ask questions.  Patient verbalized understanding of the plan and was able to repeat key elements of the plan.   This documentation was completed using Paediatric nurse.  Any transcriptional errors are unintentional.  Orders Placed This Encounter  Procedures   CBC   Comprehensive metabolic panel with GFR   Lipid panel   Microalbumin / creatinine urine ratio   Cologuard   Ambulatory referral to Ophthalmology   POCT glycosylated hemoglobin (Hb A1C)     Requested Prescriptions   Signed Prescriptions Disp Refills   atorvastatin  (LIPITOR) 20 MG tablet 30 tablet 0    Sig: Take 1 tablet (20 mg total) by mouth daily. Please schedule appointment with Dr. Vicci for additional refills.   insulin   glargine (LANTUS  SOLOSTAR) 100 UNIT/ML Solostar Pen 15 mL 3    Sig: Inject 12 Units into the skin at bedtime.   Insulin  Pen Needle 31G X 8 MM MISC 100 each 6    Sig: Use as directed   Semaglutide ,0.25 or 0.5MG /DOS, (OZEMPIC , 0.25 OR 0.5 MG/DOSE,) 2 MG/3ML SOPN 0.25 mL 0    Sig: Inject 0.25 mg into the skin once a week.   Continuous Glucose Receiver (DEXCOM G7 RECEIVER) DEVI 3 each 12    Sig: UAD to check blood sugars.   valsartan  (DIOVAN ) 40 MG tablet 30 tablet 3    Sig: Take 1 tablet (40 mg total) by mouth daily.   hydrochlorothiazide  (HYDRODIURIL ) 12.5 MG tablet 30 tablet 3    Sig: Take 1 tablet (12.5 mg total) by mouth daily.    Return in about 3 months (around 12/18/2023).  Barnie Vicci, MD, FACP

## 2023-09-21 ENCOUNTER — Other Ambulatory Visit: Payer: Self-pay
